# Patient Record
Sex: Female | Born: 1985 | Race: Black or African American | Hispanic: No | Marital: Single | Smoking: Never smoker
Health system: Southern US, Community
[De-identification: ages and names within clinical notes are randomized; demographics above are authoritative.]

## PROBLEM LIST (undated history)

## (undated) ENCOUNTER — Inpatient Hospital Stay (HOSPITAL_COMMUNITY): Payer: Self-pay

## (undated) DIAGNOSIS — Z789 Other specified health status: Secondary | ICD-10-CM

## (undated) DIAGNOSIS — Z9049 Acquired absence of other specified parts of digestive tract: Secondary | ICD-10-CM

## (undated) HISTORY — PX: APPENDECTOMY: SHX54

---

## 2013-01-01 ENCOUNTER — Encounter (HOSPITAL_BASED_OUTPATIENT_CLINIC_OR_DEPARTMENT_OTHER): Payer: Self-pay

## 2013-01-01 ENCOUNTER — Emergency Department (HOSPITAL_BASED_OUTPATIENT_CLINIC_OR_DEPARTMENT_OTHER)
Admission: EM | Admit: 2013-01-01 | Discharge: 2013-01-01 | Disposition: A | Discharge status: Home / Self Care | Payer: Self-pay | Attending: Emergency Medicine | Admitting: Emergency Medicine

## 2013-01-01 DIAGNOSIS — L02213 Cutaneous abscess of chest wall: Secondary | ICD-10-CM

## 2013-01-01 DIAGNOSIS — L02219 Cutaneous abscess of trunk, unspecified: Secondary | ICD-10-CM | POA: Insufficient documentation

## 2013-01-01 MED ORDER — MUPIROCIN CALCIUM 2 % EX CREA
TOPICAL_CREAM | Freq: Once | CUTANEOUS | Status: AC
  Administered 2013-01-01: 03:00:00 via TOPICAL

## 2013-01-01 NOTE — ED Notes (Signed)
Patient here with possible abscess to middle of chest, right along bra line. Originally started as raised red bump but today started draining

## 2013-01-01 NOTE — ED Provider Notes (Signed)
   History    CSN: [id number] Arrival date & time 01/01/13  0119  First MD Initiated Contact with Patient 01/01/13 0244     Chief Complaint  Patient presents with  . Abscess   (Consider location/radiation/quality/duration/timing/severity/associated sxs/prior Treatment) HPI This is a 27 year old female with a several day history of a "bump" in the center of her chest underlying the strap of her bra. The bra strap has been rubbing it and it has been moderately painful. It began draining yesterday. She has not been bandaging it or putting any medication. She denies any systemic symptoms.  History reviewed. No pertinent past medical history. History reviewed. No pertinent past surgical history. No family history on file. History  Substance Use Topics  . Smoking status: Never Smoker   . Smokeless tobacco: Not on file  . Alcohol Use: Not on file   OB History   Grav Para Term Preterm Abortions TAB SAB Ect Mult Living                 Review of Systems  All other systems reviewed and are negative.    Allergies  Review of patient's allergies indicates no known allergies.  Home Medications   Current Outpatient Rx  Name  Route  Sig  Dispense  Refill  . norethindrone-ethinyl estradiol (TRIPHASIL,CYCLAFEM,ALYACEN) 0.5/0.75/1-35 MG-MCG tablet   Oral   Take 1 tablet by mouth daily.          BP 130/64  Pulse 75  Temp(Src) 98.6 F (37 C) (Oral)  Resp 18  SpO2 100%  Physical Exam General: Well-developed, well-nourished female in no acute distress; appearance consistent with age of record HENT: normocephalic, atraumatic Eyes: Normal appearance Neck: supple Heart: regular rate and rhythm Lungs: clear to auscultation bilaterally Abdomen: soft; nondistended Extremities: No deformity; full range of motion Neurologic: Awake, alert and oriented; motor function intact in all extremities and symmetric; no facial droop Skin: Warm and dry; small draining abscess in the center chest  underlying location of the bra strap without fluctuance or surrounding erythema or warmth Psychiatric: Normal mood and affect    ED Course  Procedures (including critical care time)    MDM  I&D not indicated at this time his wound is draining and there is no pus pocket. We will treat with topical mupirocin.  Hanley Seamen, MD 01/01/13 [phone]

## 2013-01-12 ENCOUNTER — Emergency Department (HOSPITAL_BASED_OUTPATIENT_CLINIC_OR_DEPARTMENT_OTHER)
Admission: EM | Admit: 2013-01-12 | Discharge: 2013-01-12 | Disposition: A | Discharge status: Home / Self Care | Payer: Self-pay | Attending: Emergency Medicine | Admitting: Emergency Medicine

## 2013-01-12 ENCOUNTER — Encounter (HOSPITAL_BASED_OUTPATIENT_CLINIC_OR_DEPARTMENT_OTHER): Payer: Self-pay | Admitting: Emergency Medicine

## 2013-01-12 DIAGNOSIS — L0390 Cellulitis, unspecified: Secondary | ICD-10-CM

## 2013-01-12 DIAGNOSIS — Z79899 Other long term (current) drug therapy: Secondary | ICD-10-CM | POA: Insufficient documentation

## 2013-01-12 DIAGNOSIS — J869 Pyothorax without fistula: Secondary | ICD-10-CM | POA: Insufficient documentation

## 2013-01-12 MED ORDER — IBUPROFEN 400 MG PO TABS
400.0000 mg | ORAL_TABLET | Freq: Once | ORAL | Status: AC
  Administered 2013-01-12: 400 mg via ORAL
  Filled 2013-01-12: qty 1

## 2013-01-12 MED ORDER — SULFAMETHOXAZOLE-TRIMETHOPRIM 800-160 MG PO TABS: 1.0000 | ORAL_TABLET | Freq: Two times a day (BID) | ORAL | Status: DC

## 2013-01-12 MED ORDER — IBUPROFEN 600 MG PO TABS: 600.0000 mg | ORAL_TABLET | Freq: Four times a day (QID) | ORAL | Status: DC | PRN

## 2013-01-12 MED ORDER — SULFAMETHOXAZOLE-TMP DS 800-160 MG PO TABS
1.0000 | ORAL_TABLET | Freq: Once | ORAL | Status: AC
  Administered 2013-01-12: 1 via ORAL
  Filled 2013-01-12: qty 1

## 2013-01-12 NOTE — ED Notes (Signed)
Pt reports abscess to right flank that appeared on Monday, has been using hot compresses, but area is getting worse

## 2013-01-12 NOTE — ED Provider Notes (Signed)
   History    CSN: [id number] Arrival date & time 01/12/13  0044  First MD Initiated Contact with Patient 01/12/13 0053     Chief Complaint  Patient presents with  . Abscess   (Consider location/radiation/quality/duration/timing/severity/associated sxs/prior Treatment) Patient is a 27 y.o. female presenting with abscess. The history is provided by the patient. No language interpreter was used.  Abscess Location:  Torso Torso abscess location:  R chest (lateral chest last rib) Abscess quality: induration, painful and redness   Abscess quality: not draining   Red streaking: no   Progression:  Worsening Pain details:    Quality:  Aching   Severity:  Moderate   Timing:  Constant   Progression:  Unchanged Chronicity:  Recurrent Context: not skin injury   Relieved by:  Nothing Worsened by:  Nothing tried Ineffective treatments:  None tried Associated symptoms: no fever   Risk factors: prior abscess    History reviewed. No pertinent past medical history. History reviewed. No pertinent past surgical history. History reviewed. No pertinent family history. History  Substance Use Topics  . Smoking status: Never Smoker   . Smokeless tobacco: Not on file  . Alcohol Use: No   OB History   Grav Para Term Preterm Abortions TAB SAB Ect Mult Living                 Review of Systems  Constitutional: Negative for fever.  All other systems reviewed and are negative.    Allergies  Review of patient's allergies indicates no known allergies.  Home Medications   Current Outpatient Rx  Name  Route  Sig  Dispense  Refill  . norethindrone-ethinyl estradiol (TRIPHASIL,CYCLAFEM,ALYACEN) 0.5/0.75/1-35 MG-MCG tablet   Oral   Take 1 tablet by mouth daily.         Marland Kitchen ibuprofen (ADVIL,MOTRIN) 600 MG tablet   Oral   Take 1 tablet (600 mg total) by mouth every 6 (six) hours as needed for pain.   30 tablet   0   . sulfamethoxazole-trimethoprim (SEPTRA DS) 800-160 MG per tablet    Oral   Take 1 tablet by mouth every 12 (twelve) hours.   14 tablet   0    BP 137/76  Temp(Src) 98.6 F (37 C)  Ht 5\' 8"  (1.727 m)  Wt 240 lb (108.863 kg)  BMI 36.5 kg/m2  SpO2 100%  LMP 12/24/2012 Physical Exam  Constitutional: She is oriented to person, place, and time. She appears well-developed and well-nourished. No distress.  HENT:  Head: Normocephalic and atraumatic.  Mouth/Throat: Oropharynx is clear and moist.  Eyes: Conjunctivae are normal. Pupils are equal, round, and reactive to light.  Neck: Normal range of motion. Neck supple.  Cardiovascular: Normal rate, regular rhythm and intact distal pulses.   Pulmonary/Chest: Effort normal and breath sounds normal. She has no wheezes. She has no rales.  Abdominal: Soft. Bowel sounds are normal. There is no tenderness. There is no rebound and no guarding.  Musculoskeletal: Normal range of motion.  Neurological: She is alert and oriented to person, place, and time.  Skin: Skin is warm and dry.     Psychiatric: She has a normal mood and affect.    ED Course  Procedures (including critical care time) Labs Reviewed - No data to display No results found. 1. 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

## 2013-10-09 ENCOUNTER — Emergency Department (HOSPITAL_BASED_OUTPATIENT_CLINIC_OR_DEPARTMENT_OTHER)
Admission: EM | Admit: 2013-10-09 | Discharge: 2013-10-09 | Disposition: A | Discharge status: Home / Self Care | Payer: Medicaid Other | Attending: Emergency Medicine | Admitting: Emergency Medicine

## 2013-10-09 ENCOUNTER — Encounter (HOSPITAL_BASED_OUTPATIENT_CLINIC_OR_DEPARTMENT_OTHER): Payer: Self-pay | Admitting: Emergency Medicine

## 2013-10-09 DIAGNOSIS — Z79899 Other long term (current) drug therapy: Secondary | ICD-10-CM | POA: Insufficient documentation

## 2013-10-09 DIAGNOSIS — Z3202 Encounter for pregnancy test, result negative: Secondary | ICD-10-CM | POA: Insufficient documentation

## 2013-10-09 DIAGNOSIS — R609 Edema, unspecified: Secondary | ICD-10-CM | POA: Insufficient documentation

## 2013-10-09 DIAGNOSIS — Z792 Long term (current) use of antibiotics: Secondary | ICD-10-CM | POA: Insufficient documentation

## 2013-10-09 DIAGNOSIS — R600 Localized edema: Secondary | ICD-10-CM

## 2013-10-09 LAB — URINALYSIS, ROUTINE W REFLEX MICROSCOPIC
BILIRUBIN URINE: NEGATIVE
Glucose, UA: NEGATIVE mg/dL
HGB URINE DIPSTICK: NEGATIVE
KETONES UR: NEGATIVE mg/dL
Nitrite: NEGATIVE
PROTEIN: NEGATIVE mg/dL
SPECIFIC GRAVITY, URINE: 1.026 (ref 1.005–1.030)
UROBILINOGEN UA: 0.2 mg/dL (ref 0.0–1.0)
pH: 7 (ref 5.0–8.0)

## 2013-10-09 LAB — URINE MICROSCOPIC-ADD ON

## 2013-10-09 LAB — PREGNANCY, URINE: Preg Test, Ur: NEGATIVE

## 2013-10-09 LAB — D-DIMER, QUANTITATIVE (NOT AT ARMC): D DIMER QUANT: 0.4 ug{FEU}/mL (ref 0.00–0.48)

## 2013-10-09 NOTE — ED Notes (Signed)
Pt c/o bil lower leg swelling x 2 weeks

## 2013-10-09 NOTE — ED Provider Notes (Signed)
CSN: [id number]     Arrival date & time 10/09/13  1508 History  This chart was scribed for Tracy Hamilton Tracy Rappaport, MD by Smiley HousemanFallon Davis, ED Scribe. The patient was seen in room MH02/MH02. Patient's care was started at 3:26 PM.  Chief Complaint  Patient presents with  . Leg Swelling   The history is provided by the patient. No language interpreter was used.   HPI Comments: Tracy Hamilton is a 28 y.o. female who presents to the Emergency Department complaining of intermittent worsening bilateral lower extremity swelling that started about 2 weeks ago.  Pt reports the swelling in her left leg is worse than her right.  Pt states she has pain in her left ankle, but denies pain in her right.  She denies any injuries to her legs.  Pt states her work consists of walking and constant standing, which might have worsened the swelling.  Pt states she tries elevating her legs with some relief at night.  Pt denies trying compression socks.  Pt denies a high salt diet and using birth control pills.  She denies h/o blood clots, recent surgeries, and recent travels.  She states she is otherwise healthy.    History reviewed. No pertinent past medical history. History reviewed. No pertinent past surgical history. History reviewed. No pertinent family history. History  Substance Use Topics  . Smoking status: Never Smoker   . Smokeless tobacco: Not on file  . Alcohol Use: No   OB History   Grav Para Term Preterm Abortions TAB SAB Ect Mult Living                 Review of Systems  Constitutional: Negative for fever.  Respiratory: Negative for cough, chest tightness and shortness of breath.   Cardiovascular: Positive for leg swelling. Negative for chest pain.  Gastrointestinal: Negative for abdominal pain.  Genitourinary: Negative for dysuria and decreased urine volume.  All other systems reviewed and are negative.     Allergies  Review of patient's allergies indicates no known allergies.  Home Medications    Prior to Admission medications   Medication Sig Start Date End Date Taking? Authorizing Provider  ibuprofen (ADVIL,MOTRIN) 600 MG tablet Take 1 tablet (600 mg total) by mouth every 6 (six) hours as needed for pain. 01/12/13   April K Palumbo-Rasch, MD  norethindrone-ethinyl estradiol (TRIPHASIL,CYCLAFEM,ALYACEN) 0.5/0.75/1-35 MG-MCG tablet Take 1 tablet by mouth daily.    Historical Provider, MD  sulfamethoxazole-trimethoprim (SEPTRA DS) 800-160 MG per tablet Take 1 tablet by mouth every 12 (twelve) hours. 01/12/13   April K Palumbo-Rasch, MD   Triage Vitals: BP 106/64  Pulse 85  Temp(Src) 98.4 Hamilton (36.9 C) (Oral)  Resp 16  Ht 5\' 8"  (1.727 m)  Wt 265 lb (120.203 kg)  BMI 40.30 kg/m2  SpO2 99%  LMP 09/20/2013 Physical Exam  Nursing note and vitals reviewed. Constitutional: She is oriented to person, place, and time. She appears well-developed and well-nourished. No distress.  HENT:  Head: Normocephalic and atraumatic.  Cardiovascular: Normal rate, regular rhythm and normal heart sounds.   No murmur heard. Pulmonary/Chest: Effort normal and breath sounds normal. No respiratory distress. She has no wheezes.  Abdominal: Soft. There is no tenderness.  Musculoskeletal:  1+ bilateral lower extremity edema left slightly worse than right, 2+ DP pulses, neurovascularly intact distally, no Homans sign  Neurological: She is alert and oriented to person, place, and time.  Skin: Skin is warm and dry.  Psychiatric: She has a normal mood and affect.  ED Course  Procedures (including critical care time) DIAGNOSTIC STUDIES: Oxygen Saturation is 99% on RA, normal by my interpretation.    COORDINATION OF CARE: 3:32 PM-Will order pregnancy screen, D-dimer, and UA.  Patient informed of current plan of treatment and evaluation and agrees with plan.    Results for orders placed during the hospital encounter of 10/09/13  D-DIMER, QUANTITATIVE      Result Value Ref Range   D-Dimer, Quant 0.40   0.00 - 0.48 ug/mL-FEU  URINALYSIS, ROUTINE W REFLEX MICROSCOPIC      Result Value Ref Range   Color, Urine YELLOW  YELLOW   APPearance CLOUDY (*) CLEAR   Specific Gravity, Urine 1.026  1.005 - 1.030   pH 7.0  5.0 - 8.0   Glucose, UA NEGATIVE  NEGATIVE mg/dL   Hgb urine dipstick NEGATIVE  NEGATIVE   Bilirubin Urine NEGATIVE  NEGATIVE   Ketones, ur NEGATIVE  NEGATIVE mg/dL   Protein, ur NEGATIVE  NEGATIVE mg/dL   Urobilinogen, UA 0.2  0.0 - 1.0 mg/dL   Nitrite NEGATIVE  NEGATIVE   Leukocytes, UA TRACE (*) NEGATIVE  PREGNANCY, URINE      Result Value Ref Range   Preg Test, Ur NEGATIVE  NEGATIVE  URINE MICROSCOPIC-ADD ON      Result Value Ref Range   Squamous Epithelial / LPF FEW (*) RARE   WBC, UA 0-2  <3 WBC/hpf   Bacteria, UA RARE  RARE  '  MDM   Final diagnoses:  Edema of both legs   Patient presents with lower extremity edema. She is nontoxic on exam. She has no other complaints.  Vital signs are reassuring. She has no risk factors for DVT and dimer is negative. No evidence of proteinuria in the urine. Suspect patient's edema is dependent in nature. Discuss with patient using compression stockings and elevating her extremities when possible. [facility] was referred to [facility] for establishment of primary care.  After history, exam, and medical workup I feel the patient has been appropriately medically screened and is safe for discharge home. Pertinent diagnoses were discussed with the patient. Patient was given return precautions.   I personally performed the services described in this documentation, which was scribed in my presence. The recorded information has been reviewed and is accurate.      Tracy Hamilton Adamari Frede, MD 10/09/13 [phone]

## 2013-10-09 NOTE — Discharge Instructions (Signed)
Peripheral Edema You have swelling in your legs (peripheral edema). This swelling is due to excess accumulation of salt and water in your body. Edema may be a sign of heart, kidney or liver disease, or a side effect of a medication. It may also be due to problems in the leg veins. Elevating your legs and using special support stockings may be very helpful, if the cause of the swelling is due to poor venous circulation. Avoid long periods of standing, whatever the cause.  You edema is likely because of prolonged standing.  Treatment of edema depends on identifying the cause. Chips, pretzels, pickles and other salty foods should be avoided. Restricting salt in your diet is almost always needed. Water pills (diuretics) are often used to remove the excess salt and water from your body via urine. These medicines prevent the kidney from reabsorbing sodium. This increases urine flow.     SEEK IMMEDIATE MEDICAL CARE IF:   You have increased swelling, pain, redness, or heat in your legs.  You develop shortness of breath, especially when lying down.  You develop chest or abdominal pain, weakness, or fainting.  You have a fever. Document Released: 07/16/2004 Document Revised: 08/31/2011 Document Reviewed: 06/26/2009 [facility] Patient Information 2014 [place], [place].

## 2013-12-16 ENCOUNTER — Encounter (HOSPITAL_BASED_OUTPATIENT_CLINIC_OR_DEPARTMENT_OTHER): Payer: Self-pay | Admitting: Emergency Medicine

## 2013-12-16 ENCOUNTER — Emergency Department (HOSPITAL_BASED_OUTPATIENT_CLINIC_OR_DEPARTMENT_OTHER)
Admission: EM | Admit: 2013-12-16 | Discharge: 2013-12-16 | Disposition: A | Discharge status: Home / Self Care | Payer: Medicaid Other | Attending: Emergency Medicine | Admitting: Emergency Medicine

## 2013-12-16 ENCOUNTER — Emergency Department (HOSPITAL_BASED_OUTPATIENT_CLINIC_OR_DEPARTMENT_OTHER): Payer: Medicaid Other

## 2013-12-16 DIAGNOSIS — Z791 Long term (current) use of non-steroidal anti-inflammatories (NSAID): Secondary | ICD-10-CM | POA: Insufficient documentation

## 2013-12-16 DIAGNOSIS — S76219A Strain of adductor muscle, fascia and tendon of unspecified thigh, initial encounter: Secondary | ICD-10-CM

## 2013-12-16 DIAGNOSIS — Z3202 Encounter for pregnancy test, result negative: Secondary | ICD-10-CM | POA: Insufficient documentation

## 2013-12-16 DIAGNOSIS — R109 Unspecified abdominal pain: Secondary | ICD-10-CM | POA: Insufficient documentation

## 2013-12-16 LAB — URINALYSIS, ROUTINE W REFLEX MICROSCOPIC
Bilirubin Urine: NEGATIVE
GLUCOSE, UA: NEGATIVE mg/dL
Hgb urine dipstick: NEGATIVE
Ketones, ur: 15 mg/dL — AB
LEUKOCYTES UA: NEGATIVE
Nitrite: NEGATIVE
PROTEIN: 30 mg/dL — AB
SPECIFIC GRAVITY, URINE: 1.045 — AB (ref 1.005–1.030)
UROBILINOGEN UA: 1 mg/dL (ref 0.0–1.0)
pH: 6 (ref 5.0–8.0)

## 2013-12-16 LAB — PREGNANCY, URINE: PREG TEST UR: NEGATIVE

## 2013-12-16 LAB — URINE MICROSCOPIC-ADD ON

## 2013-12-16 MED ORDER — IBUPROFEN 800 MG PO TABS
800.0000 mg | ORAL_TABLET | Freq: Once | ORAL | Status: AC
  Administered 2013-12-16: 800 mg via ORAL
  Filled 2013-12-16: qty 1

## 2013-12-16 MED ORDER — IBUPROFEN 800 MG PO TABS: 800.0000 mg | ORAL_TABLET | Freq: Three times a day (TID) | ORAL | Status: DC

## 2013-12-16 NOTE — Discharge Instructions (Signed)
Groin Strain °A groin strain (also called a groin pull) is an injury to the muscles or tendon on the upper inner part of the thigh. These muscles are called the adductor muscles or groin muscles. They are responsible for moving the leg across the body. A muscle strain occurs when a muscle is overstretched and some muscle fibers are torn. A groin strain can range from mild to severe depending on how many muscle fibers are affected and whether the muscle fibers are partially or completely torn.  °Groin strains usually occur during exercise or participation in sports. The injury often happens when a sudden, violent force is placed on a muscle, stretching the muscle too far. A strain is more likely to occur when your muscles are not warmed up or if you are not properly conditioned. Depending on the severity of the groin strain, recovery time may vary from a few weeks to several weeks. Severe injuries often require 4-6 weeks for recovery. In these cases, complete healing can take 4-5 months.  °CAUSES  °· Stretching the groin muscles too far or too suddenly, often during side-to-side motion with an abrupt change in direction. °· Putting repeated stress on the groin muscles over a long period of time. °· Performing vigorous activity without properly stretching the groin muscles beforehand. °SYMPTOMS  °· Pain and tenderness in the groin area. This begins as sharp pain and persists as a dull ache. °· Popping or snapping feeling when the injury occurs (for severe strains). °· Swelling or bruising. °· Muscle spasms. °· Weakness in the leg. °· Stiffness in the groin area with decreased ability to move the affected muscles. °DIAGNOSIS  °Your caregiver will perform a physical exam to diagnose a groin strain. You will be asked about your symptoms and how the injury occurred. X-rays are sometimes needed to rule out a broken bone or cartilage problems. Your caregiver may order a CT scan or MRI if a complete muscle tear is  suspected. °TREATMENT  °A groin strain will often heal on its own. Your caregiver may prescribe medicines to help manage pain and swelling (anti-inflammatory medicine). You may be told to use crutches for the first few days to minimize your pain. °HOME CARE INSTRUCTIONS  °· Rest. Do not use the strained muscle if it causes pain. °· Put ice on the injured area. °¨ Put ice in a plastic bag. °¨ Place a towel between your skin and the bag. °¨ Leave the ice on for 15-20 minutes, every 2-3 hours. Do this for the first 2 days after the injury.  °· Only take over-the-counter or prescription medicines as directed by your caregiver. °· Wrap the injured area with an elastic bandage as directed by your caregiver. °· Keep the injured leg raised (elevated). °· Walk, stretch, and perform range-of-motion exercises to improve blood flow to the injured area. Only perform these activities if you can do so without any pain. °To prevent muscle strains: °· Warm up before exercise. °· Develop proper conditioning and strength in the groin muscles. °SEEK IMMEDIATE MEDICAL CARE IF:  °· You have increased pain or swelling in the affected area.   °· Your symptoms are not improving or are getting worse. °MAKE SURE YOU:  °· Understand these instructions. °· Will watch your condition. °· Will get help right away if you are not doing well or get worse. °Document Released: 02/04/2004 Document Revised: 05/25/2012 Document Reviewed: 02/10/2012 °Exit[employer]® Patient Information ©2015 [employer]. This information is not intended to replace advice given to you   by your health care provider. Make sure you discuss any questions you have with your health care provider. ° °

## 2013-12-16 NOTE — ED Notes (Signed)
Patient here with left groin pain x 2 weeks after working out, pain with ambulation and movement

## 2013-12-16 NOTE — ED Provider Notes (Signed)
CSN: [id number]     Arrival date & time 12/16/13  1151 History  This chart was scribed for Tracy OctaveStephen Rancour, MD by Tracy Hamilton, ED Scribe. The patient was seen in room MH01/MH01. Patient's care was started at 12:09 PM.   Chief Complaint  Patient presents with  . Groin Pain    The history is provided by the patient. No language interpreter was used.    HPI Comments: Tracy Hamilton is a 28 y.o. female who presents to the Emergency Department complaining of intermittent, mild to moderate left groin pain onset 2 weeks ago. Patient states that pain began shortly after exercise. She says that she usually does not have pain while at rest, but pain is worse with movement of her left leg and ambulation. She has been taking Tylenol without relief. She denies any relieving factors. She denies any recent injuries, traumas or falls. Patient has been eating and drinking normally. She denies back pain, numbness or weakness, abdominal pain, hematuria, dysuria, vaginal bleeding, constipation, diarrhea, vaginal discharge, fever, vomiting or any other symptoms at this time. She has no chronic medical conditions.   PCP - Health Department   History reviewed. No pertinent past medical history. History reviewed. No pertinent past surgical history. No family history on file. History  Substance Use Topics  . Smoking status: Never Smoker   . Smokeless tobacco: Not on file  . Alcohol Use: No   OB History   Grav Para Term Preterm Abortions TAB SAB Ect Mult Living                 Review of Systems A complete 10 system review of systems was obtained and all systems are negative except as noted in the HPI and PMH.   Allergies  Review of patient's allergies indicates no known allergies.  Home Medications   Prior to Admission medications   Medication Sig Start Date End Date Taking? Authorizing Provider  ibuprofen (ADVIL,MOTRIN) 800 MG tablet Take 1 tablet (800 mg total) by mouth 3 (three) times daily.  12/16/13   Tracy OctaveStephen Rancour, MD   Triage Vitals: BP 122/72  Pulse 88  Temp(Src) 97.9 F (36.6 C) (Oral)  Resp 15  SpO2 98% Physical Exam  Nursing note and vitals reviewed. Constitutional: She is oriented to person, place, and time. She appears well-developed and well-nourished. No distress.  HENT:  Head: Normocephalic and atraumatic.  Mouth/Throat: Oropharynx is clear and moist. No oropharyngeal exudate.  Eyes: Conjunctivae and EOM are normal. Pupils are equal, round, and reactive to light.  Neck: Normal range of motion. Neck supple.  No meningismus.  Cardiovascular: Normal rate, regular rhythm, normal heart sounds and intact distal pulses.   No murmur heard. Pulmonary/Chest: Effort normal and breath sounds normal. No respiratory distress.  Abdominal: Soft. There is no tenderness. There is no rebound and no guarding.  Genitourinary:  No inguinal or femoral hernias appreciable. Chaperone present.  Musculoskeletal: Normal range of motion. She exhibits no edema and no tenderness.  Neurological: She is alert and oriented to person, place, and time. No cranial nerve deficit. She exhibits normal muscle tone. Coordination normal.  No ataxia on finger to nose bilaterally. No pronator drift. 5/5 strength throughout. CN 2-12 intact. Negative Romberg. Equal grip strength. Sensation intact. Gait is normal.   Skin: Skin is warm.  Psychiatric: She has a normal mood and affect. Her behavior is normal.    ED Course  Procedures (including critical care time) DIAGNOSTIC STUDIES: Oxygen Saturation is 98% on room air,  normal by my interpretation.    COORDINATION OF CARE: 12:14 PM- Patient informed of current plan for treatment and evaluation and agrees with plan at this time.    Labs Review Labs Reviewed  URINALYSIS, ROUTINE W REFLEX MICROSCOPIC - Abnormal; Notable for the following:    Color, Urine AMBER (*)    Specific Gravity, Urine 1.045 (*)    Ketones, ur 15 (*)    Protein, ur 30 (*)     All other components within normal limits  URINE MICROSCOPIC-ADD ON - Abnormal; Notable for the following:    Bacteria, UA MANY (*)    All other components within normal limits  PREGNANCY, URINE    Imaging Review Dg Hip Complete Left  12/16/2013   CLINICAL DATA:  28 year old female with pain radiating to the left groin and lower extremity. Initial encounter.  EXAM: LEFT HIP - COMPLETE 2+ VIEW  COMPARISON:  None.  FINDINGS: Bone mineralization is within normal limits. Femoral heads normally located. Hip joint spaces preserved. Pelvis intact. SI joints within normal limits. Proximal left femur intact.  IMPRESSION: Negative radiographic appearance of the left hip and pelvis.   Electronically Signed   By: Augusto GambleLee  Hall M.D.   On: 12/16/2013 13:23     EKG Interpretation None      MDM   Final diagnoses:  Groin strain, initial encounter   Left groin pain for the past 2 weeks after working out. Worse with ambulation and movement. Not taking anything at home. No focal weakness, numbness or tingling. No bowel or bladder incontinence.  X-ray negative. Urinalysis shows bacteria but no other signs of infection. Patient able to ambulate. Suspect groin muscle strain. No focal weakness, numbness or tingling. She conservatively with anti-inflammatories, Rice therapy, follow up with PCP  I personally performed the services described in this documentation, which was scribed in my presence. The recorded information has been reviewed and is accurate.   Tracy OctaveStephen Rancour, MD 12/16/13 [phone]

## 2014-05-01 ENCOUNTER — Emergency Department (HOSPITAL_BASED_OUTPATIENT_CLINIC_OR_DEPARTMENT_OTHER)
Admission: EM | Admit: 2014-05-01 | Discharge: 2014-05-01 | Disposition: A | Discharge status: Home / Self Care | Payer: Medicaid Other | Attending: Emergency Medicine | Admitting: Emergency Medicine

## 2014-05-01 ENCOUNTER — Encounter (HOSPITAL_BASED_OUTPATIENT_CLINIC_OR_DEPARTMENT_OTHER): Payer: Self-pay | Admitting: *Deleted

## 2014-05-01 DIAGNOSIS — N760 Acute vaginitis: Secondary | ICD-10-CM | POA: Insufficient documentation

## 2014-05-01 DIAGNOSIS — Z79899 Other long term (current) drug therapy: Secondary | ICD-10-CM | POA: Insufficient documentation

## 2014-05-01 DIAGNOSIS — Z792 Long term (current) use of antibiotics: Secondary | ICD-10-CM | POA: Insufficient documentation

## 2014-05-01 DIAGNOSIS — B9689 Other specified bacterial agents as the cause of diseases classified elsewhere: Secondary | ICD-10-CM

## 2014-05-01 DIAGNOSIS — Z113 Encounter for screening for infections with a predominantly sexual mode of transmission: Secondary | ICD-10-CM | POA: Insufficient documentation

## 2014-05-01 DIAGNOSIS — R102 Pelvic and perineal pain: Secondary | ICD-10-CM

## 2014-05-01 DIAGNOSIS — Z711 Person with feared health complaint in whom no diagnosis is made: Secondary | ICD-10-CM

## 2014-05-01 DIAGNOSIS — R300 Dysuria: Secondary | ICD-10-CM | POA: Insufficient documentation

## 2014-05-01 LAB — URINALYSIS, ROUTINE W REFLEX MICROSCOPIC
BILIRUBIN URINE: NEGATIVE
Glucose, UA: NEGATIVE mg/dL
HGB URINE DIPSTICK: NEGATIVE
Ketones, ur: NEGATIVE mg/dL
Leukocytes, UA: NEGATIVE
Nitrite: NEGATIVE
PH: 6 (ref 5.0–8.0)
Protein, ur: NEGATIVE mg/dL
SPECIFIC GRAVITY, URINE: 1.005 (ref 1.005–1.030)
Urobilinogen, UA: 0.2 mg/dL (ref 0.0–1.0)

## 2014-05-01 LAB — WET PREP, GENITAL
TRICH WET PREP: NONE SEEN
YEAST WET PREP: NONE SEEN

## 2014-05-01 LAB — PREGNANCY, URINE: Preg Test, Ur: NEGATIVE

## 2014-05-01 MED ORDER — CEFTRIAXONE SODIUM 250 MG IJ SOLR
250.0000 mg | Freq: Once | INTRAMUSCULAR | Status: AC
  Administered 2014-05-01: 250 mg via INTRAMUSCULAR
  Filled 2014-05-01: qty 250

## 2014-05-01 MED ORDER — METRONIDAZOLE 500 MG PO TABS: 500.0000 mg | ORAL_TABLET | Freq: Two times a day (BID) | ORAL | Status: DC

## 2014-05-01 MED ORDER — LEVONORGESTREL 0.75 MG PO TABS: 0.7500 mg | ORAL_TABLET | Freq: Two times a day (BID) | ORAL | Status: DC

## 2014-05-01 MED ORDER — AZITHROMYCIN 250 MG PO TABS
1000.0000 mg | ORAL_TABLET | Freq: Once | ORAL | Status: AC
  Administered 2014-05-01: 1000 mg via ORAL
  Filled 2014-05-01: qty 4

## 2014-05-01 MED ORDER — LIDOCAINE HCL (PF) 1 % IJ SOLN
INTRAMUSCULAR | Status: AC
  Administered 2014-05-01: 2 mL
  Filled 2014-05-01: qty 5

## 2014-05-01 NOTE — Discharge Instructions (Signed)
Take antibiotic Flagyl to completion. Take Plan B as directed, it is important for you to take this as soon as possible as it is only useful within the first 72 hours. You were treated today for both gonorrhea and Chlamydia. If these tests results positive, you will be contacted and are then obligated to inform your partner.  Bacterial Vaginosis Bacterial vaginosis is a vaginal infection that occurs when the normal balance of bacteria in the vagina is disrupted. It results from an overgrowth of certain bacteria. This is the most common vaginal infection in women of childbearing age. Treatment is important to prevent complications, especially in pregnant women, as it can cause a premature delivery. CAUSES  Bacterial vaginosis is caused by an increase in harmful bacteria that are normally present in smaller amounts in the vagina. Several different kinds of bacteria can cause bacterial vaginosis. However, the reason that the condition develops is not fully understood. RISK FACTORS Certain activities or behaviors can put you at an increased risk of developing bacterial vaginosis, including:  Having a new sex partner or multiple sex partners.  Douching.  Using an intrauterine device (IUD) for contraception. Women do not get bacterial vaginosis from toilet seats, bedding, swimming pools, or contact with objects around them. SIGNS AND SYMPTOMS  Some women with bacterial vaginosis have no signs or symptoms. Common symptoms include:  Grey vaginal discharge.  A fishlike odor with discharge, especially after sexual intercourse.  Itching or burning of the vagina and vulva.  Burning or pain with urination. DIAGNOSIS  Your health care provider will take a medical history and examine the vagina for signs of bacterial vaginosis. A sample of vaginal fluid may be taken. Your health care provider will look at this sample under a microscope to check for bacteria and abnormal cells. A vaginal pH test may also  be done.  TREATMENT  Bacterial vaginosis may be treated with antibiotic medicines. These may be given in the form of a pill or a vaginal cream. A second round of antibiotics may be prescribed if the condition comes back after treatment.  HOME CARE INSTRUCTIONS   Only take over-the-counter or prescription medicines as directed by your health care provider.  If antibiotic medicine was prescribed, take it as directed. Make sure you finish it even if you start to feel better.  Do not have sex until treatment is completed.  Tell all sexual partners that you have a vaginal infection. They should see their health care provider and be treated if they have problems, such as a mild rash or itching.  Practice safe sex by using condoms and only having one sex partner. SEEK MEDICAL CARE IF:   Your symptoms are not improving after 3 days of treatment.  You have increased discharge or pain.  You have a fever. MAKE SURE YOU:   Understand these instructions.  Will watch your condition.  Will get help right away if you are not doing well or get worse. FOR MORE INFORMATION  Centers for Disease Control and Prevention, Division of STD Prevention: SolutionApps.co.zawww.cdc.gov/std American Sexual Health Association (ASHA): www.ashastd.org  Document Released: 06/08/2005 Document Revised: 03/29/2013 Document Reviewed: 01/18/2013 [facility] LP[facility] Patient Information 2015 [place], [place]. This information is not intended to replace advice given to you by your health care provider. Make sure you discuss any questions you have with your health care provider.

## 2014-05-01 NOTE — ED Notes (Signed)
Pt discharged to home NAD.  

## 2014-05-01 NOTE — ED Notes (Signed)
Vaginal pain. States she had a condom stuck in her vagina for a day but was eventually be able to remove it. She has a strange feeling inside her vagina now.

## 2014-05-01 NOTE — ED Provider Notes (Signed)
CSN: [id number]     Arrival date & time 05/01/14  1814 History   First MD Initiated Contact with Patient 05/01/14 1816     Chief Complaint  Patient presents with  . Vaginal Pain     (Consider location/radiation/quality/duration/timing/severity/associated sxs/prior Treatment) HPI Comments: This is a 28 year old female who presents to the emergency department complaining of vaginal pain 1 day. Patient reports yesterday morning she had intercourse with a condom and the condom remained in her vagina for half the day until she was able to pull it out. She reports today some discomfort in her vagina with mild dysuria. Denies vaginal discharge or odor. No vaginal bleeding. States she is monogamous with one partner. She is requesting treatment for STDs and would like to receive Plan B to prevent pregnancy. Denies abdominal pain, nausea, vomiting, fever or chills.  Patient is a 28 y.o. female presenting with vaginal pain. The history is provided by the patient.  Vaginal Pain    History reviewed. No pertinent past medical history. Past Surgical History  Procedure Laterality Date  . Appendectomy     No family history on file. History  Substance Use Topics  . Smoking status: Never Smoker   . Smokeless tobacco: Not on file  . Alcohol Use: No   OB History    No data available     Review of Systems  Genitourinary: Positive for dysuria and vaginal pain.  All other systems reviewed and are negative.     Allergies  Review of patient's allergies indicates no known allergies.  Home Medications   Prior to Admission medications   Medication Sig Start Date End Date Taking? Authorizing Provider  ibuprofen (ADVIL,MOTRIN) 800 MG tablet Take 1 tablet (800 mg total) by mouth 3 (three) times daily. 12/16/13   [name], MD  levonorgestrel (PLAN B) 0.75 MG tablet Take 1 tablet (0.75 mg total) by mouth every 12 (twelve) hours. 05/01/14   Shanay Woolman M Kaitlinn Iversen, PA-C  metroNIDAZOLE (FLAGYL) 500 MG  tablet Take 1 tablet (500 mg total) by mouth 2 (two) times daily. One po bid x 7 days 05/01/14   Graelyn Bihl M Catera Hankins, PA-C   BP 120/70 mmHg  Pulse 74  Temp(Src) 97.8 F (36.6 C) (Oral)  Resp 20  Ht 5\' 7"  (1.702 m)  Wt 210 lb (95.255 kg)  BMI 32.88 kg/m2  SpO2 100%  LMP 04/10/2014 Physical Exam  Constitutional: She is oriented to person, place, and time. She appears well-developed and well-nourished. No distress.  HENT:  Head: Normocephalic and atraumatic.  Mouth/Throat: Oropharynx is clear and moist.  Eyes: Conjunctivae are normal.  Neck: Normal range of motion. Neck supple.  Cardiovascular: Normal rate, regular rhythm and normal heart sounds.   Pulmonary/Chest: Effort normal and breath sounds normal.  Abdominal: Soft. Bowel sounds are normal. There is no tenderness.  Genitourinary: Uterus normal. Uterus is not tender. Cervix exhibits no motion tenderness, no discharge and no friability. Right adnexum displays no mass, no tenderness and no fullness. Left adnexum displays no mass, no tenderness and no fullness. No erythema, tenderness or bleeding in the vagina. No foreign body around the vagina. Vaginal discharge (scant white) found.  Musculoskeletal: Normal range of motion. She exhibits no edema.  Neurological: She is alert and oriented to person, place, and time.  Skin: Skin is warm and dry. She is not diaphoretic.  Psychiatric: She has a normal mood and affect. Her behavior is normal.  Nursing note and vitals reviewed.   ED Course  Procedures (including critical  care time) Labs Review Labs Reviewed  WET PREP, GENITAL - Abnormal; Notable for the following:    Clue Cells Wet Prep HPF POC FEW (*)    WBC, Wet Prep HPF POC FEW (*)    All other components within normal limits  GC/CHLAMYDIA PROBE AMP  URINALYSIS, ROUTINE W REFLEX MICROSCOPIC  PREGNANCY, URINE    Imaging Review No results found.   EKG Interpretation None      MDM   Final diagnoses:  BV (bacterial vaginosis)   Concern about STD in female without diagnosis  Vaginal pain   Patient nontoxic appearing and in no apparent distress. Vital signs stable. Afebrile. No cervical discharge, cervical motion tenderness or adnexal tenderness. Doubt PID. She is requesting treatment for GC/chlamydia. Rocephin and azithromycin given. Will treat with Flagyl given vaginal symptoms and few clue cells on wet prep. Pregnancy negative. She is requesting Plan B given the intercourse was yesterday. Will prescribe Plan B. GC/Chlamydia cultures pending. Stable for discharge. Return precautions given. Patient states understanding of treatment care plan and is agreeable.  Kathrynn SpeedRobyn M Libertie Hausler, PA-C 05/01/14 1911  Enid SkeensJoshua M Zavitz, MD 05/02/14 [id number]

## 2014-05-02 LAB — GC/CHLAMYDIA PROBE AMP
CT PROBE, AMP APTIMA: NEGATIVE
GC Probe RNA: NEGATIVE

## 2014-05-27 ENCOUNTER — Encounter (HOSPITAL_BASED_OUTPATIENT_CLINIC_OR_DEPARTMENT_OTHER): Payer: Self-pay | Admitting: *Deleted

## 2014-05-27 ENCOUNTER — Emergency Department (HOSPITAL_BASED_OUTPATIENT_CLINIC_OR_DEPARTMENT_OTHER)
Admission: EM | Admit: 2014-05-27 | Discharge: 2014-05-27 | Disposition: A | Discharge status: Home / Self Care | Payer: Medicaid Other | Attending: Emergency Medicine | Admitting: Emergency Medicine

## 2014-05-27 DIAGNOSIS — B373 Candidiasis of vulva and vagina: Secondary | ICD-10-CM | POA: Insufficient documentation

## 2014-05-27 DIAGNOSIS — B3731 Acute candidiasis of vulva and vagina: Secondary | ICD-10-CM

## 2014-05-27 DIAGNOSIS — B9689 Other specified bacterial agents as the cause of diseases classified elsewhere: Secondary | ICD-10-CM

## 2014-05-27 DIAGNOSIS — Z792 Long term (current) use of antibiotics: Secondary | ICD-10-CM | POA: Insufficient documentation

## 2014-05-27 DIAGNOSIS — N760 Acute vaginitis: Secondary | ICD-10-CM | POA: Insufficient documentation

## 2014-05-27 DIAGNOSIS — Z3202 Encounter for pregnancy test, result negative: Secondary | ICD-10-CM | POA: Insufficient documentation

## 2014-05-27 DIAGNOSIS — Z79899 Other long term (current) drug therapy: Secondary | ICD-10-CM | POA: Insufficient documentation

## 2014-05-27 LAB — WET PREP, GENITAL: TRICH WET PREP: NONE SEEN

## 2014-05-27 LAB — URINALYSIS, ROUTINE W REFLEX MICROSCOPIC
BILIRUBIN URINE: NEGATIVE
GLUCOSE, UA: NEGATIVE mg/dL
HGB URINE DIPSTICK: NEGATIVE
KETONES UR: NEGATIVE mg/dL
Leukocytes, UA: NEGATIVE
Nitrite: NEGATIVE
PROTEIN: NEGATIVE mg/dL
Specific Gravity, Urine: 1.014 (ref 1.005–1.030)
Urobilinogen, UA: 0.2 mg/dL (ref 0.0–1.0)
pH: 8.5 — ABNORMAL HIGH (ref 5.0–8.0)

## 2014-05-27 LAB — PREGNANCY, URINE: Preg Test, Ur: NEGATIVE

## 2014-05-27 MED ORDER — METRONIDAZOLE 500 MG PO TABS: 500.0000 mg | ORAL_TABLET | Freq: Two times a day (BID) | ORAL | Status: DC

## 2014-05-27 MED ORDER — FLUCONAZOLE 50 MG PO TABS
150.0000 mg | ORAL_TABLET | Freq: Once | ORAL | Status: AC
  Administered 2014-05-27: 150 mg via ORAL
  Filled 2014-05-27 (×2): qty 1

## 2014-05-27 NOTE — ED Provider Notes (Signed)
CSN: [id number]     Arrival date & time 05/27/14  1327 History   First MD Initiated Contact with Patient 05/27/14 1343     Chief Complaint  Patient presents with  . Vaginal Discharge     (Consider location/radiation/quality/duration/timing/severity/associated sxs/prior Treatment) HPI Comments: Patient presents with complaint of vaginal pain for the past 3 days. Pain is particularly worse with intercourse. She denies vaginal discharge, vaginal bleeding. Her last menstrual period ended 2 days ago and was normal for her. She states that she was using a new soap but stopped 3 days ago without improvement. She has some mild dysuria but no increased frequency, abdominal pain/suprapubic pain, hematuria. No nausea, vomiting, or diarrhea. No fever. Patient believes that her symptoms are due to not taking the full course of Flagyl after being diagnosed with bacterial vaginosis at last visit. Patient does not have a gynecologist. She has one partner and feels that she is low risk for sexually transmitted infection.  Patient is a 28 y.o. female presenting with vaginal discharge. The history is provided by the patient.  Vaginal Discharge Associated symptoms: dyspareunia and dysuria   Associated symptoms: no abdominal pain, no fever, no nausea and no vomiting     History reviewed. No pertinent past medical history. Past Surgical History  Procedure Laterality Date  . Appendectomy     History reviewed. No pertinent family history. History  Substance Use Topics  . Smoking status: Never Smoker   . Smokeless tobacco: Not on file  . Alcohol Use: No   OB History    No data available     Review of Systems  Constitutional: Negative for fever.  HENT: Negative for rhinorrhea and sore throat.   Eyes: Negative for redness.  Respiratory: Negative for cough.   Cardiovascular: Negative for chest pain.  Gastrointestinal: Negative for nausea, vomiting, abdominal pain and diarrhea.  Genitourinary: Positive  for dysuria, vaginal pain and dyspareunia. Negative for decreased urine volume, vaginal bleeding, vaginal discharge and difficulty urinating.  Musculoskeletal: Negative for myalgias.  Skin: Negative for rash.  Neurological: Negative for headaches.    Allergies  Review of patient's allergies indicates no known allergies.  Home Medications   Prior to Admission medications   Medication Sig Start Date End Date Taking? Authorizing Provider  ibuprofen (ADVIL,MOTRIN) 800 MG tablet Take 1 tablet (800 mg total) by mouth 3 (three) times daily. 12/16/13   [name], MD  levonorgestrel (PLAN B) 0.75 MG tablet Take 1 tablet (0.75 mg total) by mouth every 12 (twelve) hours. 05/01/14   Robyn M Hess, PA-C  metroNIDAZOLE (FLAGYL) 500 MG tablet Take 1 tablet (500 mg total) by mouth 2 (two) times daily. One po bid x 7 days 05/01/14   Robyn M Hess, PA-C   BP 139/80 mmHg  Pulse 94  Temp(Src) 98.2 F (36.8 C)  Ht 5\' 7"  (1.702 m)  Wt 210 lb (95.255 kg)  BMI 32.88 kg/m2  SpO2 100%  LMP 05/25/2014   Physical Exam  Constitutional: She appears well-developed and well-nourished.  HENT:  Head: Normocephalic and atraumatic.  Eyes: Conjunctivae are normal. Right eye exhibits no discharge. Left eye exhibits no discharge.  Neck: Normal range of motion. Neck supple.  Cardiovascular: Normal rate, regular rhythm and normal heart sounds.   Pulmonary/Chest: Effort normal and breath sounds normal.  Abdominal: Soft. There is no tenderness.  Genitourinary: There is no rash or tenderness on the right labia. There is no rash or tenderness on the left labia. Uterus is not tender. Cervix  exhibits no motion tenderness and no discharge. Right adnexum displays no mass and no tenderness. Left adnexum displays no mass and no tenderness. There is erythema and tenderness in the vagina. No bleeding in the vagina. No signs of injury around the vagina. Vaginal discharge (white) found.  Neurological: She is alert.  Skin: Skin is  warm and dry.  Psychiatric: She has a normal mood and affect.  Nursing note and vitals reviewed.   ED Course  Procedures (including critical care time) Labs Review Labs Reviewed  WET PREP, GENITAL - Abnormal; Notable for the following:    Yeast Wet Prep HPF POC FEW (*)    Clue Cells Wet Prep HPF POC FEW (*)    WBC, Wet Prep HPF POC FEW (*)    All other components within normal limits  URINALYSIS, ROUTINE W REFLEX MICROSCOPIC - Abnormal; Notable for the following:    pH 8.5 (*)    All other components within normal limits  GC/CHLAMYDIA PROBE AMP  PREGNANCY, URINE    Imaging Review No results found.   EKG Interpretation None      2:27 PM Patient seen and examined. Work-up initiated. Will perform pelvic exam.    Vital signs reviewed and are as follows: BP 139/80 mmHg  Pulse 94  Temp(Src) 98.2 F (36.8 C)  Ht 5\' 7"  (1.702 m)  Wt 210 lb (95.255 kg)  BMI 32.88 kg/m2  SpO2 100%  LMP 05/25/2014  3:01 PM Pelvic exam performed by Brixius PA-S with chaperone.   4:04 PM Wet prep suggests vaginal candidiasis. Patient given oral Diflucan in emergency department. Also with few clue cells. Given her vaginal discharge noted on exam, will treat.  Patient counseled on using nonirritating skin products as well.  Encourage follow-up with a gynecologist. Patient urged to return with worsening symptoms or other concerns. Patient verbalized understanding and agrees with plan.    MDM   Final diagnoses:  Vulvovaginal candidiasis  Bacterial vaginosis   Patient with s/s consistent with vaginitis. No true pelvic pain. No adnexal pain, discharge or cervical motion tenderness to suggest PID.    Renne CriglerJoshua Nolan Lasser, PA-C 05/27/14 1606  Enid SkeensJoshua M Zavitz, MD 05/28/14 [phone]

## 2014-05-27 NOTE — ED Notes (Addendum)
Pt c/o vaginal irritation x 2 days  Seen here 11/10 DX BV didn't complete  ABX

## 2014-05-27 NOTE — Discharge Instructions (Signed)
Please read and follow all provided instructions.  Your diagnoses today include:  1. Vulvovaginal candidiasis   2. Bacterial vaginosis    Tests performed today include:  Vital signs. See below for your results today.   Medications prescribed:   Metronidazole - antibiotic  You have been prescribed an antibiotic medicine: take the entire course of medicine even if you are feeling better. Stopping early can cause the antibiotic not to work. Do not drink alcohol when taking this medication.  Take any prescribed medications only as directed.  Home care instructions:  Follow any educational materials contained in this packet.  Follow-up instructions: Please follow-up with your primary care provider in the next 3 days for further evaluation of your symptoms.   Return instructions:   Please return to the Emergency Department if you experience worsening symptoms.   Please return if you have any other emergent concerns.  Additional Information:  Your vital signs today were: BP 139/80 mmHg   Pulse 94   Temp(Src) 98.2 F (36.8 C)   Ht 5\' 7"  (1.702 m)   Wt 210 lb (95.255 kg)   BMI 32.88 kg/m2   SpO2 100%   LMP 05/25/2014 If your blood pressure (BP) was elevated above 135/85 this visit, please have this repeated by your doctor within one month. --------------

## 2014-05-28 LAB — GC/CHLAMYDIA PROBE AMP
CT Probe RNA: NEGATIVE
GC Probe RNA: NEGATIVE

## 2014-10-05 ENCOUNTER — Emergency Department (HOSPITAL_BASED_OUTPATIENT_CLINIC_OR_DEPARTMENT_OTHER)
Admission: EM | Admit: 2014-10-05 | Discharge: 2014-10-05 | Disposition: A | Discharge status: Home / Self Care | Payer: Managed Care, Other (non HMO) | Attending: Emergency Medicine | Admitting: Emergency Medicine

## 2014-10-05 ENCOUNTER — Encounter (HOSPITAL_BASED_OUTPATIENT_CLINIC_OR_DEPARTMENT_OTHER): Payer: Self-pay

## 2014-10-05 DIAGNOSIS — Z791 Long term (current) use of non-steroidal anti-inflammatories (NSAID): Secondary | ICD-10-CM | POA: Insufficient documentation

## 2014-10-05 DIAGNOSIS — Z79899 Other long term (current) drug therapy: Secondary | ICD-10-CM | POA: Diagnosis not present

## 2014-10-05 DIAGNOSIS — O210 Mild hyperemesis gravidarum: Secondary | ICD-10-CM | POA: Insufficient documentation

## 2014-10-05 DIAGNOSIS — R110 Nausea: Secondary | ICD-10-CM

## 2014-10-05 DIAGNOSIS — Z3A08 8 weeks gestation of pregnancy: Secondary | ICD-10-CM | POA: Diagnosis not present

## 2014-10-05 DIAGNOSIS — Z3490 Encounter for supervision of normal pregnancy, unspecified, unspecified trimester: Secondary | ICD-10-CM

## 2014-10-05 LAB — URINALYSIS, ROUTINE W REFLEX MICROSCOPIC
BILIRUBIN URINE: NEGATIVE
Glucose, UA: NEGATIVE mg/dL
HGB URINE DIPSTICK: NEGATIVE
Ketones, ur: NEGATIVE mg/dL
Leukocytes, UA: NEGATIVE
Nitrite: NEGATIVE
Protein, ur: NEGATIVE mg/dL
SPECIFIC GRAVITY, URINE: 1.023 (ref 1.005–1.030)
Urobilinogen, UA: 0.2 mg/dL (ref 0.0–1.0)
pH: 6 (ref 5.0–8.0)

## 2014-10-05 LAB — PREGNANCY, URINE: PREG TEST UR: POSITIVE — AB

## 2014-10-05 MED ORDER — ONDANSETRON 4 MG PO TBDP
4.0000 mg | ORAL_TABLET | Freq: Once | ORAL | Status: AC
  Administered 2014-10-05: 4 mg via ORAL
  Filled 2014-10-05: qty 1

## 2014-10-05 MED ORDER — ONDANSETRON 4 MG PO TBDP: 4.0000 mg | ORAL_TABLET | Freq: Three times a day (TID) | ORAL | Status: DC | PRN

## 2014-10-05 NOTE — ED Notes (Signed)
Reports 5-6 episodes of emesis a day. Reports G2P1A0.

## 2014-10-05 NOTE — ED Notes (Signed)
Pt laughing on phone during vital signs

## 2014-10-05 NOTE — ED Notes (Signed)
Reports emesis x 2-3 days. Reports she is approximately [redacted] weeks pregnant. No OB at this time. Denies pain.

## 2014-10-05 NOTE — ED Provider Notes (Signed)
CSN: [id number]     Arrival date & time 10/05/14  9562 History   First MD Initiated Contact with Patient 10/05/14 [phone]     Chief Complaint  Patient presents with  . Emesis During Pregnancy      HPI  Position evaluation of nausea and vomiting. Currently pregnant [redacted] weeks by date. Had a confirmation of her pregnancy at health department a week ago. No pain no bleeding no cramping. "Morning sickness" with "symptoms lasting all day". Totally worse in the morning does improve but persistently vomited last evening. No episodes this morning. Additionally for hematuria. No pain or cramping. No bleeding. No discharge.  History reviewed. No pertinent past medical history. Past Surgical History  Procedure Laterality Date  . Appendectomy     No family history on file. History  Substance Use Topics  . Smoking status: Never Smoker   . Smokeless tobacco: Not on file  . Alcohol Use: No   OB History    Gravida Para Term Preterm AB TAB SAB Ectopic Multiple Living   1              Review of Systems  Constitutional: Negative for fever, chills, diaphoresis, appetite change and fatigue.  HENT: Negative for mouth sores, sore throat and trouble swallowing.   Eyes: Negative for visual disturbance.  Respiratory: Negative for cough, chest tightness, shortness of breath and wheezing.   Cardiovascular: Negative for chest pain.  Gastrointestinal: Positive for nausea and vomiting. Negative for abdominal pain, diarrhea and abdominal distention.  Endocrine: Negative for polydipsia, polyphagia and polyuria.  Genitourinary: Negative for dysuria, frequency and hematuria.  Musculoskeletal: Negative for gait problem.  Skin: Negative for color change, pallor and rash.  Neurological: Negative for dizziness, syncope, light-headedness and headaches.  Hematological: Does not bruise/bleed easily.  Psychiatric/Behavioral: Negative for behavioral problems and confusion.      Allergies  Review of patient's  allergies indicates no known allergies.  Home Medications   Prior to Admission medications   Medication Sig Start Date End Date Taking? Authorizing Provider  ibuprofen (ADVIL,MOTRIN) 800 MG tablet Take 1 tablet (800 mg total) by mouth 3 (three) times daily. 12/16/13   Glynn Octave, MD  levonorgestrel (PLAN B) 0.75 MG tablet Take 1 tablet (0.75 mg total) by mouth every 12 (twelve) hours. 05/01/14   Robyn M Hess, PA-C  metroNIDAZOLE (FLAGYL) 500 MG tablet Take 1 tablet (500 mg total) by mouth 2 (two) times daily. One po bid x 7 days 05/27/14   Renne Crigler, PA-C  ondansetron (ZOFRAN ODT) 4 MG disintegrating tablet Take 1 tablet (4 mg total) by mouth every 8 (eight) hours as needed for nausea. 10/05/14   Rolland Porter, MD   BP 120/62 mmHg  Pulse 82  Temp(Src) 98.1 F (36.7 C) (Oral)  Resp 18  Ht  (1.676 m)  Wt 210 lb (95.255 kg)  BMI 33.91 kg/m2  SpO2 100%  LMP 05/25/2014 Physical Exam  Constitutional: She is oriented to person, place, and time. She appears well-developed and well-nourished. No distress.  HENT:  Head: Normocephalic.  Eyes: Conjunctivae are normal. Pupils are equal, round, and reactive to light. No scleral icterus.  Neck: Normal range of motion. Neck supple. No thyromegaly present.  Cardiovascular: Normal rate and regular rhythm.  Exam reveals no gallop and no friction rub.   No murmur heard. Pulmonary/Chest: Effort normal and breath sounds normal. No respiratory distress. She has no wheezes. She has no rales.  Abdominal: Soft. Bowel sounds are normal. She exhibits no  distension. There is no tenderness. There is no rebound.  Musculoskeletal: Normal range of motion.  Neurological: She is alert and oriented to person, place, and time.  Skin: Skin is warm and dry. No rash noted.  Psychiatric: She has a normal mood and affect. Her behavior is normal.    ED Course  Procedures (including critical care time) Labs Review Labs Reviewed  PREGNANCY, URINE  URINALYSIS,  ROUTINE W REFLEX MICROSCOPIC    Imaging Review No results found.   EKG Interpretation None      MDM   Final diagnoses:  Nausea  Pregnancy    Normal exam. Not tachycardic. Moist mucous membranes. No emesis this morning and tolerating some by mouth liquids. Given Zofran dose and prescription. keep your appointments with OB/GYN/ health department.    Rolland PorterMark Ezri Landers, MD 10/05/14 [phone]

## 2014-10-05 NOTE — Discharge Instructions (Signed)

## 2014-10-18 LAB — OB RESULTS CONSOLE GC/CHLAMYDIA
Chlamydia: NEGATIVE
GC PROBE AMP, GENITAL: NEGATIVE

## 2014-10-18 LAB — OB RESULTS CONSOLE RUBELLA ANTIBODY, IGM: RUBELLA: IMMUNE

## 2014-10-18 LAB — OB RESULTS CONSOLE HGB/HCT, BLOOD
HCT: 42 %
HEMOGLOBIN: 13.4 g/dL

## 2014-10-18 LAB — OB RESULTS CONSOLE ABO/RH: RH Type: POSITIVE

## 2014-10-18 LAB — OB RESULTS CONSOLE HIV ANTIBODY (ROUTINE TESTING): HIV: NONREACTIVE

## 2014-10-18 LAB — OB RESULTS CONSOLE ANTIBODY SCREEN: ANTIBODY SCREEN: NEGATIVE

## 2014-10-18 LAB — OB RESULTS CONSOLE PLATELET COUNT: PLATELETS: 373 10*3/uL

## 2014-10-18 LAB — OB RESULTS CONSOLE HEPATITIS B SURFACE ANTIGEN: Hepatitis B Surface Ag: NEGATIVE

## 2014-10-18 LAB — OB RESULTS CONSOLE VARICELLA ZOSTER ANTIBODY, IGG: Varicella: IMMUNE

## 2014-10-18 LAB — OB RESULTS CONSOLE RPR: RPR: NONREACTIVE

## 2014-10-19 ENCOUNTER — Other Ambulatory Visit (HOSPITAL_COMMUNITY): Payer: Self-pay | Admitting: Nurse Practitioner

## 2014-10-19 DIAGNOSIS — Z3682 Encounter for antenatal screening for nuchal translucency: Secondary | ICD-10-CM

## 2014-11-05 ENCOUNTER — Ambulatory Visit (HOSPITAL_COMMUNITY)
Admission: RE | Admit: 2014-11-05 | Discharge: 2014-11-05 | Disposition: A | Discharge status: Home / Self Care | Payer: Managed Care, Other (non HMO) | Source: Ambulatory Visit | Attending: Nurse Practitioner | Admitting: Nurse Practitioner

## 2014-11-05 ENCOUNTER — Encounter (HOSPITAL_COMMUNITY): Payer: Self-pay

## 2014-11-05 DIAGNOSIS — O3421 Maternal care for scar from previous cesarean delivery: Secondary | ICD-10-CM | POA: Diagnosis not present

## 2014-11-05 DIAGNOSIS — Z36 Encounter for antenatal screening of mother: Secondary | ICD-10-CM | POA: Diagnosis not present

## 2014-11-05 DIAGNOSIS — Z3A13 13 weeks gestation of pregnancy: Secondary | ICD-10-CM | POA: Insufficient documentation

## 2014-11-05 DIAGNOSIS — Z3682 Encounter for antenatal screening for nuchal translucency: Secondary | ICD-10-CM | POA: Insufficient documentation

## 2014-11-05 HISTORY — DX: Other specified health status: Z78.9

## 2014-11-07 ENCOUNTER — Other Ambulatory Visit (HOSPITAL_COMMUNITY): Payer: Self-pay | Admitting: Nurse Practitioner

## 2014-11-15 ENCOUNTER — Other Ambulatory Visit (HOSPITAL_COMMUNITY): Payer: Self-pay | Admitting: Nurse Practitioner

## 2014-11-15 DIAGNOSIS — Z3689 Encounter for other specified antenatal screening: Secondary | ICD-10-CM

## 2014-12-14 ENCOUNTER — Ambulatory Visit (HOSPITAL_COMMUNITY)
Admission: RE | Admit: 2014-12-14 | Discharge: 2014-12-14 | Disposition: A | Discharge status: Home / Self Care | Payer: Managed Care, Other (non HMO) | Source: Ambulatory Visit | Attending: Nurse Practitioner | Admitting: Nurse Practitioner

## 2014-12-14 ENCOUNTER — Ambulatory Visit (HOSPITAL_COMMUNITY): Admission: RE | Admit: 2014-12-14 | Payer: Managed Care, Other (non HMO) | Source: Ambulatory Visit

## 2014-12-14 DIAGNOSIS — Z3A19 19 weeks gestation of pregnancy: Secondary | ICD-10-CM | POA: Diagnosis not present

## 2014-12-14 DIAGNOSIS — O3421 Maternal care for scar from previous cesarean delivery: Secondary | ICD-10-CM | POA: Insufficient documentation

## 2014-12-14 DIAGNOSIS — Z36 Encounter for antenatal screening of mother: Secondary | ICD-10-CM | POA: Diagnosis not present

## 2014-12-14 DIAGNOSIS — Z3689 Encounter for other specified antenatal screening: Secondary | ICD-10-CM | POA: Insufficient documentation

## 2014-12-18 ENCOUNTER — Other Ambulatory Visit (HOSPITAL_COMMUNITY): Payer: Self-pay | Admitting: Nurse Practitioner

## 2014-12-18 DIAGNOSIS — Z0489 Encounter for examination and observation for other specified reasons: Secondary | ICD-10-CM

## 2014-12-18 DIAGNOSIS — IMO0002 Reserved for concepts with insufficient information to code with codable children: Secondary | ICD-10-CM

## 2015-01-18 ENCOUNTER — Ambulatory Visit (HOSPITAL_COMMUNITY)
Admission: RE | Admit: 2015-01-18 | Discharge: 2015-01-18 | Disposition: A | Discharge status: Home / Self Care | Payer: Managed Care, Other (non HMO) | Source: Ambulatory Visit | Attending: Nurse Practitioner | Admitting: Nurse Practitioner

## 2015-01-18 DIAGNOSIS — Z0489 Encounter for examination and observation for other specified reasons: Secondary | ICD-10-CM | POA: Insufficient documentation

## 2015-01-18 DIAGNOSIS — Z36 Encounter for antenatal screening of mother: Secondary | ICD-10-CM | POA: Insufficient documentation

## 2015-01-18 DIAGNOSIS — Z3A23 23 weeks gestation of pregnancy: Secondary | ICD-10-CM | POA: Insufficient documentation

## 2015-01-18 DIAGNOSIS — IMO0002 Reserved for concepts with insufficient information to code with codable children: Secondary | ICD-10-CM | POA: Insufficient documentation

## 2015-02-20 LAB — OB RESULTS CONSOLE RPR: RPR: NONREACTIVE

## 2015-02-26 ENCOUNTER — Encounter (HOSPITAL_COMMUNITY): Payer: Self-pay | Admitting: *Deleted

## 2015-02-26 ENCOUNTER — Inpatient Hospital Stay (HOSPITAL_COMMUNITY)
Admission: AD | Admit: 2015-02-26 | Discharge: 2015-02-26 | Disposition: A | Discharge status: Home / Self Care | Payer: Managed Care, Other (non HMO) | Source: Ambulatory Visit | Attending: Obstetrics & Gynecology | Admitting: Obstetrics & Gynecology

## 2015-02-26 DIAGNOSIS — Z3A28 28 weeks gestation of pregnancy: Secondary | ICD-10-CM | POA: Insufficient documentation

## 2015-02-26 DIAGNOSIS — N898 Other specified noninflammatory disorders of vagina: Secondary | ICD-10-CM | POA: Diagnosis present

## 2015-02-26 DIAGNOSIS — O26893 Other specified pregnancy related conditions, third trimester: Secondary | ICD-10-CM | POA: Insufficient documentation

## 2015-02-26 DIAGNOSIS — O4690 Antepartum hemorrhage, unspecified, unspecified trimester: Secondary | ICD-10-CM | POA: Diagnosis not present

## 2015-02-26 LAB — URINALYSIS, ROUTINE W REFLEX MICROSCOPIC
BILIRUBIN URINE: NEGATIVE
Glucose, UA: NEGATIVE mg/dL
HGB URINE DIPSTICK: NEGATIVE
KETONES UR: NEGATIVE mg/dL
Leukocytes, UA: NEGATIVE
NITRITE: NEGATIVE
PROTEIN: NEGATIVE mg/dL
SPECIFIC GRAVITY, URINE: 1.025 (ref 1.005–1.030)
UROBILINOGEN UA: 0.2 mg/dL (ref 0.0–1.0)
pH: 5.5 (ref 5.0–8.0)

## 2015-02-26 LAB — WET PREP, GENITAL
TRICH WET PREP: NONE SEEN
Yeast Wet Prep HPF POC: NONE SEEN

## 2015-02-26 NOTE — MAU Note (Signed)
Not in lobby

## 2015-02-26 NOTE — Progress Notes (Signed)
Wynelle Bourgeois CNM  Notified of pt's arrival and compaints

## 2015-02-26 NOTE — MAU Note (Signed)
Pt reports pinkish discharge. Denies pain.

## 2015-02-26 NOTE — MAU Provider Note (Signed)
History     CSN: [id number]  Arrival date and time: 02/26/15 2001   First Provider Initiated Contact with Patient 02/26/15 2037      No chief complaint on file.  HPI  Patient is 29 y.o. [id number] [redacted]w[redacted]d here with complaints of pink vaginal discharge.  Ms. Tracy Hamilton reports that about two hours ago, she noticed pink discharge on toilet paper after using the bathroom. She denies spotting or bleeding previously in this pregnancy. She said the discharge was pink, not streaky, and mucinous. It did not have a noticeable smell. She denies cramps or abdominal pain. Her last bowel movement was earlier today, and she has not had intercourse recently. She denies contractions, LOF, and endorses FM. She denies dysuria or vaginal itching. Denies any recent falls/injuries.    Past Medical History  Diagnosis Date  . Medical history non-contributory     Past Surgical History  Procedure Laterality Date  . Appendectomy    . Cesarean section      History reviewed. No pertinent family history.  Social History  Substance Use Topics  . Smoking status: Never Smoker   . Smokeless tobacco: None  . Alcohol Use: No    Allergies: No Known Allergies  Prescriptions prior to admission  Medication Sig Dispense Refill Last Dose  . ibuprofen (ADVIL,MOTRIN) 800 MG tablet Take 1 tablet (800 mg total) by mouth 3 (three) times daily. (Patient not taking: Reported on 11/05/2014) 21 tablet 0 Not Taking  . levonorgestrel (PLAN B) 0.75 MG tablet Take 1 tablet (0.75 mg total) by mouth every 12 (twelve) hours. (Patient not taking: Reported on 11/05/2014) 2 tablet 0 Not Taking  . metroNIDAZOLE (FLAGYL) 500 MG tablet Take 1 tablet (500 mg total) by mouth 2 (two) times daily. One po bid x 7 days (Patient not taking: Reported on 11/05/2014) 14 tablet 0 Not Taking  . ondansetron (ZOFRAN ODT) 4 MG disintegrating tablet Take 1 tablet (4 mg total) by mouth every 8 (eight) hours as needed for nausea. (Patient not taking: Reported on  11/05/2014) 16 tablet 0 Not Taking  . Prenatal Vit-Fe Fumarate-FA (PRENATAL VITAMIN PO) Take by mouth.       Review of Systems  Gastrointestinal: Negative for abdominal pain and constipation.  Genitourinary: Negative for dysuria, urgency, frequency and hematuria.  Musculoskeletal: Negative for falls.   Physical Exam   Blood pressure 112/59, pulse 77, temperature 98.6 F (37 C), temperature source Oral, resp. rate 18, height 5\' 7"  (1.702 m), weight 229 lb (103.874 kg), last menstrual period 08/09/2014, SpO2 99 %.  Physical Exam  Constitutional: She is oriented to person, place, and time. She appears well-developed and well-nourished. No distress.  HENT:  Head: Normocephalic and atraumatic.  Cardiovascular: Normal rate and regular rhythm.   Respiratory: Effort normal. No respiratory distress.  GI:  Gravid  Genitourinary:  White mucinous discharge on speculum exam; no blood noted. Cervix closed.   Neurological: She is alert and oriented to person, place, and time. No cranial nerve deficit.  Psychiatric: She has a normal mood and affect. Her behavior is normal.    MAU Course  Procedures None  MDM Vaginal speculum exam: white mucinous discharge, no blood noted Wet prep: few clue cells, few WBCs Irritability seen on strip (but no real contractions)  Urinalysis: no signs of UTI  Assessment and Plan  A: Patient is 29 y.o. G3P1011 [redacted]w[redacted]d reporting pink vaginal discharge. Could be due to irritability, but given no signs of concerning etiology, will discharge.   P: Discharge  home - Reviewed findings and my conclusion - Preterm labor precautions dicussed, esp increased bleeding or appearance of clots  - Handout given - Follow-up with OB provider (next appt scheduled 09/13)   Tarri Abernethy, MD PGY-1 [facility]  02/26/2015, 10:13 PM   Seen and examined by me also Agree with note  Filed Vitals:   02/26/15 2213  BP: 130/69  Pulse: 79  Temp:   Resp:     FHR reassuring Uterine irritability noted, not felt by patient Dilation: Closed Effacement (%): Thick Exam by:: Dr Natale Milch   Discharged home with PTL precautions.  Follow up in clinic for reexamination  Aviva Signs, CNM

## 2015-02-26 NOTE — Discharge Instructions (Signed)
Premature Rupture and Preterm Premature Rupture of Membranes °A sac made up of membranes surrounds your baby in the womb (uterus). When this sac breaks before contractions or labor starts, it is called premature rupture of membranes (PROM). Rupture of membranes is also known as your water breaking. If this happens before 37 weeks, it is called preterm premature rupture of membranes (PPROM). PPROM is serious. It needs medical care right away. °CAUSES  °PROM may be caused by the membranes getting weak. This happens at the end of pregnancy. PPROM is often due to an infection, but can be caused by a number of other things.  °SIGNS OF PROM OR PPROM °· A sudden gush of fluid from the vagina. °· A slow leak of fluid from the vagina. °· Your underwear stay wet. °WHAT TO DO IF YOU THINK YOUR WATER BROKE °Call your doctor right away. You will need to go to the hospital to get checked right away. °WHAT HAPPENS IF YOU ARE TOLD YOU HAVE PROM OR PPROM? °You will have tests done at the hospital. If you have PROM, you may be given medicine to start labor (induced). This may happen if you are not having contractions within 24 hours of your water breaking. If you have PPROM and are not having contractions, you may be given medicine to start labor. It will depend on how far along you are in your pregnancy. °If you have PPROM, you: °· And your baby will be watched closely for signs of infection or other problems. °· May be given an antibiotic medicine. This can stop an infection from starting. °· May be given a steroid medicine. This can help the lungs to develop faster. °· May be given a medicine to stop early labor (preterm labor). °· May be told to stay in bed except to use the restroom (bed rest). °· May be given medicine to start labor. This can happen if there are problems with you or the baby. °Your treatment will depend on many factors. °Document Released: 09/04/2008 Document Revised: 02/08/2013 Document Reviewed:  09/27/2012 °ExitCare® Patient Information ©2015 [employer]. This information is not intended to replace advice given to you by your health care provider. Make sure you discuss any questions you have with your health care provider. ° °

## 2015-04-18 LAB — OB RESULTS CONSOLE GC/CHLAMYDIA
Chlamydia: NEGATIVE
GC PROBE AMP, GENITAL: NEGATIVE

## 2015-04-18 LAB — OB RESULTS CONSOLE GBS: GBS: POSITIVE

## 2015-05-06 ENCOUNTER — Inpatient Hospital Stay (HOSPITAL_COMMUNITY)
Admission: AD | Admit: 2015-05-06 | Discharge: 2015-05-11 | DRG: 766 | Disposition: A | Discharge status: Home / Self Care | Payer: Managed Care, Other (non HMO) | Source: Ambulatory Visit | Attending: Family Medicine | Admitting: Family Medicine

## 2015-05-06 ENCOUNTER — Inpatient Hospital Stay (HOSPITAL_COMMUNITY)
Admission: AD | Admit: 2015-05-06 | Discharge: 2015-05-06 | Disposition: A | Discharge status: Home / Self Care | Payer: Managed Care, Other (non HMO) | Source: Ambulatory Visit | Attending: Obstetrics & Gynecology | Admitting: Obstetrics & Gynecology

## 2015-05-06 ENCOUNTER — Encounter (HOSPITAL_COMMUNITY): Payer: Self-pay | Admitting: *Deleted

## 2015-05-06 DIAGNOSIS — O4593 Premature separation of placenta, unspecified, third trimester: Principal | ICD-10-CM | POA: Diagnosis present

## 2015-05-06 DIAGNOSIS — Z6838 Body mass index (BMI) 38.0-38.9, adult: Secondary | ICD-10-CM

## 2015-05-06 DIAGNOSIS — IMO0001 Reserved for inherently not codable concepts without codable children: Secondary | ICD-10-CM

## 2015-05-06 DIAGNOSIS — O34211 Maternal care for low transverse scar from previous cesarean delivery: Secondary | ICD-10-CM | POA: Diagnosis present

## 2015-05-06 DIAGNOSIS — O99214 Obesity complicating childbirth: Secondary | ICD-10-CM | POA: Diagnosis present

## 2015-05-06 DIAGNOSIS — O99824 Streptococcus B carrier state complicating childbirth: Secondary | ICD-10-CM | POA: Diagnosis present

## 2015-05-06 DIAGNOSIS — Z6839 Body mass index (BMI) 39.0-39.9, adult: Secondary | ICD-10-CM

## 2015-05-06 DIAGNOSIS — Z3A39 39 weeks gestation of pregnancy: Secondary | ICD-10-CM

## 2015-05-06 DIAGNOSIS — O4590 Premature separation of placenta, unspecified, unspecified trimester: Secondary | ICD-10-CM | POA: Diagnosis present

## 2015-05-06 DIAGNOSIS — O4690 Antepartum hemorrhage, unspecified, unspecified trimester: Secondary | ICD-10-CM | POA: Diagnosis present

## 2015-05-06 HISTORY — DX: Acquired absence of other specified parts of digestive tract: Z90.49

## 2015-05-06 LAB — CBC
HCT: 38.7 % (ref 36.0–46.0)
Hemoglobin: 12.9 g/dL (ref 12.0–15.0)
MCH: 29.7 pg (ref 26.0–34.0)
MCHC: 33.3 g/dL (ref 30.0–36.0)
MCV: 89.2 fL (ref 78.0–100.0)
PLATELETS: 279 10*3/uL (ref 150–400)
RBC: 4.34 MIL/uL (ref 3.87–5.11)
RDW: 14.2 % (ref 11.5–15.5)
WBC: 8 10*3/uL (ref 4.0–10.5)

## 2015-05-06 LAB — TYPE AND SCREEN
ABO/RH(D): O POS
ANTIBODY SCREEN: NEGATIVE

## 2015-05-06 MED ORDER — CITRIC ACID-SODIUM CITRATE 334-500 MG/5ML PO SOLN: 30.0000 mL | ORAL | Status: DC | PRN

## 2015-05-06 MED ORDER — BUTORPHANOL TARTRATE 2 MG/ML IJ SOLN
2.0000 mg | INTRAMUSCULAR | Status: DC | PRN
Start: 2015-05-06 — End: 2015-05-11
  Administered 2015-05-06 – 2015-05-07 (×2): 2 mg via INTRAVENOUS
  Filled 2015-05-06 (×2): qty 2

## 2015-05-06 MED ORDER — OXYCODONE-ACETAMINOPHEN 5-325 MG PO TABS: 2.0000 | ORAL_TABLET | ORAL | Status: DC | PRN

## 2015-05-06 MED ORDER — LACTATED RINGERS IV SOLN
500.0000 mL | INTRAVENOUS | Status: DC | PRN
  Administered 2015-05-07 (×2): 500 mL via INTRAVENOUS

## 2015-05-06 MED ORDER — CITRIC ACID-SODIUM CITRATE 334-500 MG/5ML PO SOLN
30.0000 mL | ORAL | Status: DC | PRN
  Administered 2015-05-08: 30 mL via ORAL

## 2015-05-06 MED ORDER — OXYCODONE-ACETAMINOPHEN 5-325 MG PO TABS: 1.0000 | ORAL_TABLET | ORAL | Status: DC | PRN

## 2015-05-06 MED ORDER — OXYTOCIN 40 UNITS IN LACTATED RINGERS INFUSION - SIMPLE MED: 62.5000 mL/h | INTRAVENOUS | Status: DC

## 2015-05-06 MED ORDER — PROMETHAZINE HCL 25 MG/ML IJ SOLN
25.0000 mg | Freq: Once | INTRAMUSCULAR | Status: DC | PRN
  Administered 2015-05-06: 25 mg via INTRAVENOUS
  Filled 2015-05-06: qty 1

## 2015-05-06 MED ORDER — DIPHENHYDRAMINE HCL 50 MG/ML IJ SOLN: 25.0000 mg | Freq: Once | INTRAMUSCULAR | Status: DC | PRN

## 2015-05-06 MED ORDER — OXYTOCIN BOLUS FROM INFUSION: 500.0000 mL | INTRAVENOUS | Status: DC

## 2015-05-06 MED ORDER — LACTATED RINGERS IV SOLN: INTRAVENOUS | Status: DC

## 2015-05-06 MED ORDER — DEXTROSE 5 % IV SOLN: 5.0000 10*6.[IU] | Freq: Once | INTRAVENOUS | Status: DC

## 2015-05-06 MED ORDER — TERBUTALINE SULFATE 1 MG/ML IJ SOLN: 0.2500 mg | Freq: Once | INTRAMUSCULAR | Status: DC | PRN

## 2015-05-06 MED ORDER — LIDOCAINE HCL (PF) 1 % IJ SOLN: 30.0000 mL | INTRAMUSCULAR | Status: DC | PRN

## 2015-05-06 MED ORDER — LACTATED RINGERS IV SOLN
INTRAVENOUS | Status: DC
  Administered 2015-05-07 (×3): via INTRAVENOUS

## 2015-05-06 MED ORDER — ONDANSETRON HCL 4 MG/2ML IJ SOLN: 4.0000 mg | Freq: Four times a day (QID) | INTRAMUSCULAR | Status: DC | PRN

## 2015-05-06 MED ORDER — LACTATED RINGERS IV SOLN: 500.0000 mL | INTRAVENOUS | Status: DC | PRN

## 2015-05-06 MED ORDER — PENICILLIN G POTASSIUM 5000000 UNITS IJ SOLR: 2.5000 10*6.[IU] | INTRAMUSCULAR | Status: DC

## 2015-05-06 MED ORDER — OXYTOCIN 40 UNITS IN LACTATED RINGERS INFUSION - SIMPLE MED
1.0000 m[IU]/min | INTRAVENOUS | Status: DC
  Administered 2015-05-06: 2 m[IU]/min via INTRAVENOUS
  Administered 2015-05-06: 1 m[IU]/min via INTRAVENOUS
  Administered 2015-05-07: 3 m[IU]/min via INTRAVENOUS
  Filled 2015-05-06: qty 1000

## 2015-05-06 MED ORDER — ACETAMINOPHEN 325 MG PO TABS: 650.0000 mg | ORAL_TABLET | ORAL | Status: DC | PRN

## 2015-05-06 NOTE — Progress Notes (Signed)
States she was examined in MAU around 0700. States she noted bright red vaginal bleeding that started around 1300. Passed some clots about an hour ago. Contractions off and on, worse last 2-3 hours.

## 2015-05-06 NOTE — MAU Note (Signed)
Pt c/o contractions that were every 4 mins that started around 4am. States that they have spaced out some now that shes here. Rates 9/10. Denies LOF or vag bleeding. Having pink mucousy discharge. +FM. Cervix was closed last week.

## 2015-05-06 NOTE — MAU Note (Signed)
C/o ucs since 0400 this AM; denies SROM; noticed some pinkish spotting; desires to VBAC;

## 2015-05-06 NOTE — Discharge Instructions (Signed)
Vaginal Birth After Cesarean Delivery  Vaginal birth after cesarean delivery (VBAC) is giving birth vaginally after previously delivering a baby by a cesarean. In the past, if a woman had a cesarean delivery, all births afterward would be done by cesarean delivery. This is no longer true. It can be safe for the mother to try a vaginal delivery after having a cesarean delivery.   It is important to discuss VBAC with your health care provider early in the pregnancy so you can understand the risks, benefits, and options. It will give you time to decide what is best in your particular case. The final decision about whether to have a VBAC or repeat cesarean delivery should be between you and your health care provider. Any changes in your health or your baby's health during your pregnancy may make it necessary to change your initial decision about VBAC.   WOMEN WHO PLAN TO HAVE A VBAC SHOULD CHECK WITH THEIR HEALTH CARE PROVIDER TO BE SURE THAT:  · The previous cesarean delivery was done with a low transverse uterine cut (incision) (not a vertical classical incision).    · The birth canal is big enough for the baby.    · There were no other operations on the uterus.    · An electronic fetal monitor (EFM) will be on at all times during labor.    · An operating room will be available and ready in case an emergency cesarean delivery is needed.    · A health care provider and surgical nursing staff will be available at all times during labor to be ready to do an emergency delivery cesarean if necessary.    · An anesthesiologist will be present in case an emergency cesarean delivery is needed.    · The nursery is prepared and has adequate personnel and necessary equipment available to care for the baby in case of an emergency cesarean delivery.  BENEFITS OF VBAC  · Shorter stay in the hospital.    · Avoidance of risks associated with cesarean delivery, such as:    Surgical complications, such as opening of the incision or  hernia in the incision.    Injury to other organs.    Fever. This can occur if an infection develops after surgery. It can also occur as a reaction to the medicine given to make you numb during the surgery.  · Less blood loss and need for blood transfusions.  · Lower risk of blood clots and infection.   · Shorter recovery.    · Decreased risk for having to remove the uterus (hysterectomy).    · Decreased risk for the placenta to completely or partially cover the opening of the uterus (placenta previa) with a future pregnancy.    · Decrease risk in future labor and delivery.  RISKS OF A VBAC  · Tearing (rupture) of the uterus. This is occurs in less than 1% of VBACs. The risk of this happening is higher if:    Steps are taken to begin the labor process (induce labor) or stimulate or strengthen contractions (augment labor).      Medicine is used to soften (ripen) the cervix.  · Having to remove the uterus (hysterectomy) if it ruptures.  VBAC SHOULD NOT BE DONE IF:  · The previous cesarean delivery was done with a vertical (classical) or T-shaped incision or you do not know what kind of incision was made.    · You had a ruptured uterus.    · You have had certain types of surgery on your uterus, such as removal of uterine fibroids.   Ask your health care provider about other types of surgeries that prevent you from having a VBAC.  · You have certain medical or childbirth (obstetrical) problems.    · There are problems with the baby.    · You have had two previous cesarean deliveries and no vaginal deliveries.  OTHER FACTS TO KNOW ABOUT VBAC:  · It is safe to have an epidural anesthetic with VBAC.    · It is safe to turn the baby from a breech position (attempt an external cephalic version).    · It is safe to try a VBAC with twins.    · VBAC may not be successful if your baby weights 8.8 lb (4 kg) or more. However, weight predictions are not always accurate and should not be used alone to decide if VBAC is right for  you.  · There is an increased failure rate if the time between the cesarean delivery and VBAC is less than 19 months.    · Your health care provider may advise against a VBAC if you have preeclampsia (high blood pressure, protein in the urine, and swelling of face and extremities).    · VBAC is often successful if you previously gave birth vaginally.    · VBAC is often successful when the labor starts spontaneously before the due date.    · Delivering a baby through a VBAC is similar to having a normal spontaneous vaginal delivery.     This information is not intended to replace advice given to you by your health care provider. Make sure you discuss any questions you have with your health care provider.     Document Released: 11/29/2006 Document Revised: 06/29/2014 Document Reviewed: 01/05/2013  Elsevier Interactive Patient Education ©2016 Else[employer] Inc[employer]

## 2015-05-06 NOTE — H&P (Addendum)
Tracy Hamilton is a 29 y.o.G3P1101 @ 39.3 in with c/o vag bleeding with clots and contractions. Denies ROM. Prev LTCS desires TOLAC. GBS pos. Maternal Medical History:  Reason for admission: Contractions and vaginal bleeding.   Contractions: Onset was 6-12 hours ago.   Frequency: irregular.   Perceived severity is mild.    Fetal activity: Perceived fetal activity is normal.   Last perceived fetal movement was within the past hour.      OB History    Gravida Para Term Preterm AB TAB SAB Ectopic Multiple Living   3 1 1  1 1    1      Past Medical History  Diagnosis Date  . Medical history non-contributory    Past Surgical History  Procedure Laterality Date  . Appendectomy    . Cesarean section     Family History: family history is negative for Alcohol abuse, Arthritis, Asthma, Birth defects, Cancer, COPD, Depression, Diabetes, Drug abuse, Early death, Hearing loss, Heart disease, Hyperlipidemia, Hypertension, Kidney disease, Learning disabilities, Mental illness, Mental retardation, Miscarriages / Stillbirths, Stroke, Vision loss, and Varicose Veins. Social History:  reports that she has never smoked. She does not have any smokeless tobacco history on file. She reports that she does not drink alcohol or use illicit drugs.   Prenatal Transfer Tool  Maternal Diabetes: No Genetic Screening: Normal Maternal Ultrasounds/Referrals: Normal Fetal Ultrasounds or other Referrals:  None Maternal Substance Abuse:  No Significant Maternal Medications:  None Significant Maternal Lab Results:  Lab values include: Group B Strep positive Other Comments:  None  Review of Systems  Constitutional: Negative.   HENT: Negative.   Eyes: Negative.   Respiratory: Negative.   Cardiovascular: Negative.   Gastrointestinal: Positive for abdominal pain.  Genitourinary: Negative.   Musculoskeletal: Negative.   Skin: Negative.   Neurological: Negative.   Endo/Heme/Allergies: Negative.    Psychiatric/Behavioral: Negative.       Last menstrual period 08/09/2014. Maternal Exam:  Uterine Assessment: Contraction strength is mild.  Contraction frequency is regular.   Abdomen: Patient reports no abdominal tenderness. Surgical scars: low transverse.   Estimated fetal weight is 8lbs.   Fetal presentation: vertex  Introitus: Normal vulva. Normal vagina.  Amniotic fluid character: not assessed. sm-mod amt dark vag bleeding with sm clots  Cervix: Cervix evaluated by digital exam.     Fetal Exam Fetal Monitor Review: Mode: ultrasound.   Variability: moderate (6-25 bpm).    Fetal State Assessment: Category I - tracings are normal.     Physical Exam  Constitutional: She is oriented to person, place, and time. She appears well-developed and well-nourished.  HENT:  Head: Normocephalic.  Eyes: Pupils are equal, round, and reactive to light.  Neck: Normal range of motion.  Cardiovascular: Normal rate, regular rhythm, normal heart sounds and intact distal pulses.   Respiratory: Effort normal and breath sounds normal.  GI: Soft. Bowel sounds are normal.  Genitourinary: Vagina normal and uterus normal.  Musculoskeletal: Normal range of motion.  Neurological: She is alert and oriented to person, place, and time. She has normal reflexes.  Skin: Skin is warm and dry.  Psychiatric: She has a normal mood and affect. Her behavior is normal. Judgment and thought content normal.    Prenatal labs: ABO, Rh:   Antibody:   Rubella:   RPR:    HBsAg:    HIV:    GBS: Positive (10/27 0000)   Assessment/Plan: SVE ft/th/post/high sm-mod amt dark bleeding with sm clots noted with exam. FHR patter stable  at present. Will consult Dr. Otilio Miu, MARIE DARLENE 05/06/2015, 5:27 PM   Attestation of Attending Supervision of Advanced Practitioner (PA/CNM/NP): Evaluation and management procedures were performed by the Advanced Practitioner under my supervision and collaboration.  I  have reviewed the Advanced Practitioner's note and chart, and I agree with the management and plan.  Candelaria Celeste, DO Attending Physician Faculty Practice, [facility] [place]

## 2015-05-07 ENCOUNTER — Inpatient Hospital Stay (HOSPITAL_COMMUNITY): Payer: Managed Care, Other (non HMO) | Admitting: Anesthesiology

## 2015-05-07 LAB — RPR: RPR Ser Ql: NONREACTIVE

## 2015-05-07 LAB — ABO/RH: ABO/RH(D): O POS

## 2015-05-07 MED ORDER — FENTANYL 2.5 MCG/ML BUPIVACAINE 1/10 % EPIDURAL INFUSION (WH - ANES)
14.0000 mL/h | INTRAMUSCULAR | Status: DC | PRN
  Administered 2015-05-07 (×4): 14 mL/h via EPIDURAL
  Filled 2015-05-07 (×3): qty 125

## 2015-05-07 MED ORDER — PENICILLIN G POTASSIUM 5000000 UNITS IJ SOLR
2.5000 10*6.[IU] | INTRAVENOUS | Status: DC
  Administered 2015-05-07 (×4): 2.5 10*6.[IU] via INTRAVENOUS
  Filled 2015-05-07 (×9): qty 2.5

## 2015-05-07 MED ORDER — OXYTOCIN 40 UNITS IN LACTATED RINGERS INFUSION - SIMPLE MED
1.0000 m[IU]/min | INTRAVENOUS | Status: DC
  Administered 2015-05-07: 2 m[IU]/min via INTRAVENOUS
  Administered 2015-05-07: 18 m[IU]/min via INTRAVENOUS
  Administered 2015-05-07: 8 m[IU]/min via INTRAVENOUS

## 2015-05-07 MED ORDER — PENICILLIN G POTASSIUM 5000000 UNITS IJ SOLR: 5.0000 10*6.[IU] | Freq: Once | INTRAMUSCULAR | Status: DC

## 2015-05-07 MED ORDER — DIPHENHYDRAMINE HCL 50 MG/ML IJ SOLN: 12.5000 mg | INTRAMUSCULAR | Status: DC | PRN

## 2015-05-07 MED ORDER — PENICILLIN G POTASSIUM 5000000 UNITS IJ SOLR: 5.0000 10*6.[IU] | Freq: Once | INTRAVENOUS | Status: DC

## 2015-05-07 MED ORDER — PHENYLEPHRINE 40 MCG/ML (10ML) SYRINGE FOR IV PUSH (FOR BLOOD PRESSURE SUPPORT)
80.0000 ug | PREFILLED_SYRINGE | INTRAVENOUS | Status: AC | PRN
  Administered 2015-05-08: 40 ug via INTRAVENOUS
  Administered 2015-05-08: 80 ug via INTRAVENOUS
  Administered 2015-05-08: 40 ug via INTRAVENOUS
  Filled 2015-05-07 (×2): qty 20

## 2015-05-07 MED ORDER — PENICILLIN G POTASSIUM 5000000 UNITS IJ SOLR
5.0000 10*6.[IU] | Freq: Once | INTRAVENOUS | Status: AC
  Administered 2015-05-07: 5 10*6.[IU] via INTRAVENOUS
  Filled 2015-05-07: qty 5

## 2015-05-07 MED ORDER — EPHEDRINE 5 MG/ML INJ: 10.0000 mg | INTRAVENOUS | Status: DC | PRN

## 2015-05-07 MED ORDER — LIDOCAINE HCL (PF) 1 % IJ SOLN
INTRAMUSCULAR | Status: DC | PRN
  Administered 2015-05-07: 4 mL via EPIDURAL
  Administered 2015-05-07: 2 mL via EPIDURAL
  Administered 2015-05-07: 4 mL
  Administered 2015-05-07: 4 mL via EPIDURAL
  Administered 2015-05-07: 4 mL

## 2015-05-07 MED ORDER — PENICILLIN G POTASSIUM 5000000 UNITS IJ SOLR: 2.5000 10*6.[IU] | INTRAVENOUS | Status: DC

## 2015-05-07 NOTE — Progress Notes (Signed)
Uvaldo BristleShameka Borror is a 29 y.o. G3P1011 at 5456w4d admitted for active labor, history of LTCS for nonreassuring fetal surveillance.   Subjective: Pt comfortable with epidural.  S/O in room for support.  Objective: BP 124/57 mmHg  Pulse 77  Temp(Src) 97.9 F (36.6 C) (Oral)  Resp 20  Ht 5\' 6"  (1.676 m)  Wt 109.317 kg (241 lb)  BMI 38.92 kg/m2  SpO2 100%  LMP 08/09/2014      FHT:  FHR: 140 bpm, variability: moderate,  accelerations:  Present,  decelerations:  Absent UC:   regular, every 5-6 minutes SVE:   Dilation: 3 Effacement (%): 80 Station: -3 Exam by:: stenson  Labs: Lab Results  Component Value Date   WBC 8.0 05/06/2015   HGB 12.9 05/06/2015   HCT 38.7 05/06/2015   MCV 89.2 05/06/2015   PLT 279 05/06/2015    Assessment / Plan: Augmentation of labor, progressing well  Earlier had fetal intolerance of labor, Pitocin turned off then restarted. Pitocin restarted and FHR status improved with inadequate contractions.  Will continue to monitor.  Preeclampsia:  n/a Fetal Wellbeing:  Category I Pain Control:  Epidural I/D:  n/a Anticipated MOD:  undetermined  LEFTWICH-KIRBY, Jean Alejos 05/07/2015, 9:56 AM

## 2015-05-07 NOTE — Progress Notes (Signed)
Assumed care of pt after receiving report. Pt moved to rt lateral, from supine,peanut ball in place. Pt comfortable after second epidural placement.

## 2015-05-07 NOTE — Progress Notes (Signed)
Epidural catheter out  Dr Gentry RochJudd at bedside will replace

## 2015-05-07 NOTE — Progress Notes (Signed)
Pitocin initiated at 2 mu.

## 2015-05-07 NOTE — Progress Notes (Signed)
   Tracy Hamilton is a 29 y.o. G3P1011 at 6388w4d  admitted for induction of labor due to vaginal bleeding/contractions. She had a foley that came out at 0400.  Labor has been adequate since 1730.  Subjective:  Comfortable with epidural Objective: Filed Vitals:   05/07/15 1911 05/07/15 1916 05/07/15 1921 05/07/15 1930  BP: 100/62 118/72 117/61   Pulse: 78 73 75   Temp:    99.1 F (37.3 C)  TempSrc:    Oral  Resp:    20  Height:      Weight:      SpO2:          FHT:  FHR: 140 bpm, variability: moderate,  accelerations:  Present,  decelerations:  Present Has occ late decel, but variability has always been moderate and accels are frequent.  UC:   MVU's 250 SVE:   Dilation: 4 Effacement (%): 80 Station: -2 Exam by:: Drenda FreezeFran CresDishmon Pitocin @ 14 mu/min  Labs: Lab Results  Component Value Date   WBC 8.0 05/06/2015   HGB 12.9 05/06/2015   HCT 38.7 05/06/2015   MCV 89.2 05/06/2015   PLT 279 05/06/2015    Assessment / Plan: no cervical change, adequate labor for 3 hours.  Pt is agreeable to continuing TOLAC for at least a few more hours.    Labor: adequate, no cervical change Fetal Wellbeing:  Category I and Category II Pain Control:  Epidural Anticipated MOD:  hopeful for VBAC  CRESENZO-DISHMAN,Penni Penado 05/07/2015, 8:24 PM

## 2015-05-07 NOTE — Progress Notes (Signed)
Labor Progress Note  Tracy Hamilton is a 29 y.o. G3P1011 at 1350w4d admitted for SOL and TOLAC. Concern for possible placental abruption given initial presentation with vaginal bleeding.   S: Patient was able to rest some overnight with stadol and phenergan.   O:  BP 113/66 mmHg  Pulse 76  Temp(Src) 97.5 F (36.4 C) (Oral)  Resp 16  Ht 5\' 6"  (1.676 m)  Wt 109.317 kg (241 lb)  BMI 38.92 kg/m2  LMP 08/09/2014    FHT:  FHR: 130 bpm, variability: moderate,  accelerations:  Present,  decelerations:  Present Occasional early  UC:   regular, every 3-5 minutes SVE:   Dilation: 3 Effacement (%): 50 Station: -3 Exam by:: B boyer, RN SROM @ 0353: mustard-colored meconium in fluid  Pitocin @ 5 mu/min  Labs: Lab Results  Component Value Date   WBC 8.0 05/06/2015   HGB 12.9 05/06/2015   HCT 38.7 05/06/2015   MCV 89.2 05/06/2015   PLT 279 05/06/2015    Assessment / Plan: 29 y.o. G3P1011 8650w4d in early labor Augmentation of labor, progressing well  Labor: Progressing well after FB placed, SROM and continued pitocin. Fetal Wellbeing:  Category I Pain Control:  Planning on epidural Anticipated MOD:  NSVD  Expectant management  Dani GobbleHillary Fitzgerald, MD [facility], PGY-1

## 2015-05-07 NOTE — Progress Notes (Signed)
Tracy BristleShameka Hamilton is a 29 y.o. G3P1011 at 2242w4d admitted for induction of labor due to marginal abruption.  Subjective: Patient comfortable.  SROM at 3:45.  Had prolonged decel for 2 minutes with recovery with position change.   Has had a couple of late decelerations after.    Objective: BP 120/76 mmHg  Pulse 71  Temp(Src) 98.5 F (36.9 C) (Oral)  Resp 20  Ht 5\' 6"  (1.676 m)  Wt 241 lb (109.317 kg)  BMI 38.92 kg/m2  SpO2 100%  LMP 08/09/2014      FHT:  FHR: 150 bpm, variability: moderate,  accelerations:  Abscent,  decelerations:  Present late UC:   regular, every 7 minutes SVE:   Dilation: 3 Effacement (%): 80 Station: -3 Exam by:: stenson  Labs: Lab Results  Component Value Date   WBC 8.0 05/06/2015   HGB 12.9 05/06/2015   HCT 38.7 05/06/2015   MCV 89.2 05/06/2015   PLT 279 05/06/2015    Assessment / Plan: Foley catheter out at 4am.  Pitocin at 5milliunits.  IUPC placed.  Patient continued to have decelerations - stop pitocin.  Will see how baby recovers. Baby may not tolerate labor and may need to proceed with c/s - this was discussed with patient.  Philipp Callegari JEHIEL 05/07/2015, 7:44 AM

## 2015-05-07 NOTE — Anesthesia Preprocedure Evaluation (Addendum)
Anesthesia Evaluation  Patient identified by MRN, date of birth, ID band Patient awake    Reviewed: Allergy & Precautions, H&P , NPO status , Patient's Chart, lab work & pertinent test results  Airway Mallampati: II  TM Distance: >3 FB Neck ROM: full    Dental no notable dental hx.    Pulmonary neg pulmonary ROS,    Pulmonary exam normal breath sounds clear to auscultation       Cardiovascular negative cardio ROS Normal cardiovascular exam Rhythm:regular Rate:Normal     Neuro/Psych negative neurological ROS  negative psych ROS   GI/Hepatic negative GI ROS, Neg liver ROS,   Endo/Other  Morbid obesity  Renal/GU negative Renal ROS  negative genitourinary   Musculoskeletal   Abdominal   Peds  Hematology negative hematology ROS (+)   Anesthesia Other Findings Pregnancy - pt is TOLAC  Platelets and allergies reviewed Denies active cardiac or pulmonary symptoms, METS > 4  Denies blood thinning medications, bleeding disorders, hypertension, asthma, supine hypotension syndrome, previous anesthesia difficulties    Reproductive/Obstetrics (+) Pregnancy                            Anesthesia Physical Anesthesia Plan  ASA: III and emergent  Anesthesia Plan: Epidural   Post-op Pain Management:    Induction:   Airway Management Planned:   Additional Equipment:   Intra-op Plan:   Post-operative Plan:   Informed Consent: I have reviewed the patients History and Physical, chart, labs and discussed the procedure including the risks, benefits and alternatives for the proposed anesthesia with the patient or authorized representative who has indicated his/her understanding and acceptance.   Dental advisory given  Plan Discussed with: CRNA, Anesthesiologist and Surgeon  Anesthesia Plan Comments: (Patient for urgent C/Section for non reassuring FHR tracing. Will use epidural for C/Section. M.  Malen GauzeFoster, MD)      Anesthesia Quick Evaluation

## 2015-05-07 NOTE — Progress Notes (Signed)
Tracy Hamilton is a 29 y.o. G3P1011 at 1362w4d admitted for active labor TOLAC  Subjective: Pt reports feeling more cramping/contractions in her lower abdomen. Pain is intermittent, with contractions.  Objective: BP 136/71 mmHg  Pulse 80  Temp(Src) 98.1 F (36.7 C) (Axillary)  Resp 18  Ht 5\' 6"  (1.676 m)  Wt 109.317 kg (241 lb)  BMI 38.92 kg/m2  SpO2 100%  LMP 08/09/2014   Total I/O In: -  Out: 1600 [Urine:1600]  FHT:  FHR: 140 bpm, variability: moderate,  accelerations:  Present,  decelerations:  Present variables, resolved with position change UC:   regular, every 3-4 minutes, MVUs 180 SVE:   Dilation: 4 Effacement (%): 80 Station: -2 Exam by:: Leftwich_Kirby  Labs: Lab Results  Component Value Date   WBC 8.0 05/06/2015   HGB 12.9 05/06/2015   HCT 38.7 05/06/2015   MCV 89.2 05/06/2015   PLT 279 05/06/2015    Assessment / Plan: Augmentation of labor, progressing well  Labor: No cervical change, plan to increase Pitocin as FHR tolerates,RN to reposition pt to encourage fetal descent, give preset dose for epidural to reduce pain.  RN to notify CNM if pain persists. Preeclampsia:  n/a Fetal Wellbeing:  Category II Pain Control:  Epidural I/D:  n/a Anticipated MOD:  NSVD  LEFTWICH-KIRBY, Wyeth Hoffer 05/07/2015, 5:59 PM

## 2015-05-07 NOTE — Progress Notes (Signed)
Pitocin restarted at 2211.  Now FHR with prolonged decel, cx still 5/80/-2; ctx q 2-4 minutes.  Dr Jolayne PantherOnstant notified.  Pit off/02 on. RLTCS for NRFHT, and failed IOL.

## 2015-05-07 NOTE — Progress Notes (Signed)
Tracy Hamilton is a 29 y.o. G3P1011 at 7247w4d admitted for active labor with hx of LTCS.  Subjective: Pt comfortable with epidural. Pitocin off related to FHR tracing.  Objective: BP 130/79 mmHg  Pulse 71  Temp(Src) 97.8 F (36.6 C) (Oral)  Resp 20  Ht 5\' 6"  (1.676 m)  Wt 109.317 kg (241 lb)  BMI 38.92 kg/m2  SpO2 100%  LMP 08/09/2014   Total I/O In: -  Out: 1600 [Urine:1600]  FHT:  FHR: 130 bpm, variability: moderate,  accelerations:  Present,  decelerations:  Absent UC:   irregular, rare SVE:   Dilation: 4 Effacement (%): 80 Station: -2 Exam by:: Leggett IUPC replaced by Sharen CounterLisa Leftwich-Kirby, CNM, at 10 o'clock position.  Pt tolerated well.   Labs: Lab Results  Component Value Date   WBC 8.0 05/06/2015   HGB 12.9 05/06/2015   HCT 38.7 05/06/2015   MCV 89.2 05/06/2015   PLT 279 05/06/2015    Assessment / Plan: Augmentation of labor, progressing well  Pitocin off related to FHR tracing  Labor: Dr Penne LashLeggett evaluated FHR tracing, checked pt cervix.  Pt desires TOLAC.  Plan to restart Pitocin at 2 milliunits/min and increase by 2 per protocol.  Will continue to monitor.  Preeclampsia:  n/a Fetal Wellbeing:  Category I Pain Control:  Epidural I/D:  n/a Anticipated MOD:  NSVD  LEFTWICH-KIRBY, Balin Vandegrift 05/07/2015, 3:14 PM

## 2015-05-07 NOTE — Plan of Care (Signed)
Problem: Education: Goal: Knowledge of Childbirth will improve Outcome: Progressing Continue to allow for successful TOLAC at this time.

## 2015-05-07 NOTE — Progress Notes (Signed)
Patient ID: Tracy Hamilton, female   DOB: 08/18/1985, 29 y.o.   MRN: [id number]   Patient had a 2 minute deceleration just prior to 1 pm.  The pitocin was discontinued and the tracing returned to baseline with good variability.  Patient had just reached adequate contractions just as pitocin was discontinued.  Cervix 4/80 and soft.  Head well applied.  Patient would like to continue VBAC attempt.  Will restart pitocin at 2 milliunit.  IUPC will need to be replaced as RN says it is dislodged.

## 2015-05-07 NOTE — Progress Notes (Signed)
Tracy Hamilton is a 29 y.o. G3P1011 at 3773w4d admitted for active labor  Subjective: Pt comfortable with epidural  Objective: BP 100/60 mmHg  Pulse 78  Temp(Src) 97.7 F (36.5 C) (Axillary)  Resp 20  Ht 5\' 6"  (1.676 m)  Wt 109.317 kg (241 lb)  BMI 38.92 kg/m2  SpO2 100%  LMP 08/09/2014      FHT:  FHR: 135 bpm, variability: moderate,  accelerations:  Present,  decelerations:  Present repetitive variables followed by prolonged decels x 2 with return to baseline and resolution of decels after position change, Pitocin off UC:   regular, every 3 minutes SVE:   Dilation: 4 Effacement (%): 80 Station: -2 Exam by:: leftwich-kirby  Labs: Lab Results  Component Value Date   WBC 8.0 05/06/2015   HGB 12.9 05/06/2015   HCT 38.7 05/06/2015   MCV 89.2 05/06/2015   PLT 279 05/06/2015    Assessment / Plan: Augmentation of labor  Inadequate contractions without cervical change  Labor: Ctx inadequate with FHR changes and Pitocin off Preeclampsia:  n/a Fetal Wellbeing:  Category II Pain Control:  Epidural I/D:  n/a Anticipated MOD:  undecided  LEFTWICH-KIRBY, Tracy Hamilton 05/07/2015, 1:10 PM

## 2015-05-07 NOTE — Anesthesia Procedure Notes (Addendum)
Epidural Patient location during procedure: OB  Staffing Anesthesiologist: Vasil Juhasz, BENJAMIN Performed by: anesthesiologist   Preanesthetic Checklist Completed: patient identified, site marked, surgical consent, pre-op evaluation, timeout performed, IV checked, risks and benefits discussed and monitors and equipment checked  Epidural Patient position: sitting Prep: site prepped and draped and DuraPrep Patient monitoring: continuous pulse ox and blood pressure Approach: midline Location: L3-L4 Injection technique: LOR saline  Needle:  Needle type: Tuohy  Needle gauge: 17 G Needle length: 9 cm and 9 Needle insertion depth: 7 cm Catheter type: closed end flexible Catheter size: 19 Gauge Catheter at skin depth: 12 cm Test dose: negative  Assessment Events: blood not aspirated, injection not painful, no injection resistance, negative IV test and no paresthesia  Additional Notes Patient identified. Risks/Benefits/Options discussed with patient including but not limited to bleeding, infection, nerve damage, paralysis, failed block, incomplete pain control, headache, blood pressure changes, nausea, vomiting, reactions to medications, itching and postpartum back pain. Confirmed with bedside nurse the patient's most recent platelet count. Confirmed with patient that they are not currently taking any anticoagulation, have any bleeding history or any family history of bleeding disorders. Patient expressed understanding and wished to proceed. All questions were answered. Sterile technique was used throughout the entire procedure. Please see nursing notes for vital signs. Test dose was given through epidural catheter and negative prior to continuing to dose epidural or start infusion. Warning signs of high block given to the patient including shortness of breath, tingling/numbness in hands, complete motor block, or any concerning symptoms with instructions to call for help. Patient was given  instructions on fall risk and not to get out of bed. All questions and concerns addressed with instructions to call with any issues or inadequate analgesia.  Reason for block:procedure for pain  Epidural Patient location during procedure: OB  Staffing Anesthesiologist: Braulio Kiedrowski Performed by: anesthesiologist   Preanesthetic Checklist Completed: patient identified, site marked, surgical consent, pre-op evaluation, timeout performed, IV checked, risks and benefits discussed and monitors and equipment checked  Epidural Patient position: sitting Prep: site prepped and draped and DuraPrep Patient monitoring: continuous pulse ox and blood pressure Approach: midline Location: L3-L4 Injection technique: LOR saline  Needle:  Needle type: Tuohy  Needle gauge: 17 G Needle length: 9 cm and 9 Needle insertion depth: 7 cm Catheter type: closed end flexible Catheter size: 19 Gauge Catheter at skin depth: 14 cm Test dose: negative  Assessment Events: blood not aspirated, injection not painful, no injection resistance, negative IV test and no paresthesia  Additional Notes Patient identified. Risks/Benefits/Options discussed with patient including but not limited to bleeding, infection, nerve damage, paralysis, failed block, incomplete pain control, headache, blood pressure changes, nausea, vomiting, reactions to medication both or allergic, itching and postpartum back pain. Confirmed with bedside nurse the patient's most recent platelet count. Confirmed with patient that they are not currently taking any anticoagulation, have any bleeding history or any family history of bleeding disorders. Patient expressed understanding and wished to proceed. All questions were answered. Sterile technique was used throughout the entire procedure. Please see nursing notes for vital signs. Test dose was given through epidural catheter and negative prior to continuing to dose epidural or start infusion. Warning  signs of high block given to the patient including shortness of breath, tingling/numbness in hands, complete motor block, or any concerning symptoms with instructions to call for help. Patient was given instructions on fall risk and not to get out of bed. All questions and concerns addressed with instructions  to call with any issues or inadequate analgesia.

## 2015-05-07 NOTE — Progress Notes (Signed)
Pt had 10 minutes of prologed variables/late decels. Variability moderate. Pit d/c'd, O2 and IVF bolus.  Ctx now spaced out, q 6 minutes, decels resolved.  Will restart pitocin around 2200 Cx 5/80/-2

## 2015-05-07 NOTE — Progress Notes (Signed)
Turned pt to left side from rt side, fhr decels noted, turned back to rt side, pitocin off, hob down, iv bolus initiated and O2 placed via nonrebreather, F. Cresdishmon notified.

## 2015-05-08 ENCOUNTER — Encounter (HOSPITAL_COMMUNITY)
Admission: AD | Disposition: A | Discharge status: Home / Self Care | Payer: Self-pay | Source: Ambulatory Visit | Attending: Family Medicine

## 2015-05-08 ENCOUNTER — Encounter (HOSPITAL_COMMUNITY): Payer: Self-pay

## 2015-05-08 DIAGNOSIS — O99824 Streptococcus B carrier state complicating childbirth: Secondary | ICD-10-CM

## 2015-05-08 DIAGNOSIS — Z3A39 39 weeks gestation of pregnancy: Secondary | ICD-10-CM

## 2015-05-08 DIAGNOSIS — O99214 Obesity complicating childbirth: Secondary | ICD-10-CM

## 2015-05-08 DIAGNOSIS — O4593 Premature separation of placenta, unspecified, third trimester: Secondary | ICD-10-CM

## 2015-05-08 SURGERY — Surgical Case
Anesthesia: Epidural | Site: Abdomen

## 2015-05-08 MED ORDER — MEPERIDINE HCL 25 MG/ML IJ SOLN
INTRAMUSCULAR | Status: DC | PRN
  Administered 2015-05-08 (×2): 12.5 mg via INTRAVENOUS

## 2015-05-08 MED ORDER — KETOROLAC TROMETHAMINE 30 MG/ML IJ SOLN
INTRAMUSCULAR | Status: AC
  Filled 2015-05-08: qty 1

## 2015-05-08 MED ORDER — SODIUM BICARBONATE 8.4 % IV SOLN
INTRAVENOUS | Status: DC | PRN
  Administered 2015-05-08 (×2): 5 mL via EPIDURAL

## 2015-05-08 MED ORDER — KETOROLAC TROMETHAMINE 30 MG/ML IJ SOLN
30.0000 mg | Freq: Four times a day (QID) | INTRAMUSCULAR | Status: AC | PRN
  Administered 2015-05-08: 30 mg via INTRAMUSCULAR

## 2015-05-08 MED ORDER — OXYTOCIN 10 UNIT/ML IJ SOLN
40.0000 [IU] | INTRAVENOUS | Status: DC | PRN
  Administered 2015-05-08: 40 [IU] via INTRAVENOUS

## 2015-05-08 MED ORDER — MENTHOL 3 MG MT LOZG: 1.0000 | LOZENGE | OROMUCOSAL | Status: DC | PRN

## 2015-05-08 MED ORDER — SIMETHICONE 80 MG PO CHEW
80.0000 mg | CHEWABLE_TABLET | ORAL | Status: DC
  Administered 2015-05-08 – 2015-05-10 (×3): 80 mg via ORAL
  Filled 2015-05-08 (×3): qty 1

## 2015-05-08 MED ORDER — SODIUM CHLORIDE 0.9 % IJ SOLN: 3.0000 mL | INTRAMUSCULAR | Status: DC | PRN

## 2015-05-08 MED ORDER — ONDANSETRON HCL 4 MG/2ML IJ SOLN
INTRAMUSCULAR | Status: DC | PRN
  Administered 2015-05-08: 4 mg via INTRAVENOUS

## 2015-05-08 MED ORDER — IBUPROFEN 600 MG PO TABS
600.0000 mg | ORAL_TABLET | Freq: Four times a day (QID) | ORAL | Status: DC
  Administered 2015-05-08 – 2015-05-11 (×12): 600 mg via ORAL
  Filled 2015-05-08 (×12): qty 1

## 2015-05-08 MED ORDER — WITCH HAZEL-GLYCERIN EX PADS
1.0000 | MEDICATED_PAD | CUTANEOUS | Status: DC | PRN
Start: 2015-05-08 — End: 2015-05-11

## 2015-05-08 MED ORDER — DIBUCAINE 1 % RE OINT: 1.0000 "application " | TOPICAL_OINTMENT | RECTAL | Status: DC | PRN

## 2015-05-08 MED ORDER — MORPHINE SULFATE (PF) 0.5 MG/ML IJ SOLN
INTRAMUSCULAR | Status: DC | PRN
  Administered 2015-05-08: .5 mg via EPIDURAL
  Administered 2015-05-08: 4 mg via EPIDURAL
  Administered 2015-05-08: .5 mg via EPIDURAL

## 2015-05-08 MED ORDER — SCOPOLAMINE 1 MG/3DAYS TD PT72
MEDICATED_PATCH | TRANSDERMAL | Status: DC | PRN
  Administered 2015-05-08: 1 via TRANSDERMAL

## 2015-05-08 MED ORDER — LACTATED RINGERS IV SOLN
INTRAVENOUS | Status: DC
  Administered 2015-05-08: 07:00:00 via INTRAVENOUS

## 2015-05-08 MED ORDER — OXYTOCIN 40 UNITS IN LACTATED RINGERS INFUSION - SIMPLE MED: 62.5000 mL/h | INTRAVENOUS | Status: AC

## 2015-05-08 MED ORDER — DIPHENHYDRAMINE HCL 50 MG/ML IJ SOLN: 12.5000 mg | INTRAMUSCULAR | Status: DC | PRN

## 2015-05-08 MED ORDER — OXYCODONE-ACETAMINOPHEN 5-325 MG PO TABS
2.0000 | ORAL_TABLET | ORAL | Status: DC | PRN
  Administered 2015-05-09 – 2015-05-11 (×4): 2 via ORAL
  Filled 2015-05-08 (×4): qty 2

## 2015-05-08 MED ORDER — NALBUPHINE HCL 10 MG/ML IJ SOLN: 5.0000 mg | Freq: Once | INTRAMUSCULAR | Status: DC | PRN

## 2015-05-08 MED ORDER — SODIUM CHLORIDE 0.9 % IR SOLN
Status: DC | PRN
  Administered 2015-05-08: 1000 mL

## 2015-05-08 MED ORDER — FENTANYL CITRATE (PF) 100 MCG/2ML IJ SOLN: 25.0000 ug | INTRAMUSCULAR | Status: DC | PRN

## 2015-05-08 MED ORDER — NALBUPHINE HCL 10 MG/ML IJ SOLN
INTRAMUSCULAR | Status: AC
  Filled 2015-05-08: qty 1

## 2015-05-08 MED ORDER — SIMETHICONE 80 MG PO CHEW
80.0000 mg | CHEWABLE_TABLET | Freq: Three times a day (TID) | ORAL | Status: DC
  Administered 2015-05-08 – 2015-05-11 (×9): 80 mg via ORAL
  Filled 2015-05-08 (×10): qty 1

## 2015-05-08 MED ORDER — LANOLIN HYDROUS EX OINT
1.0000 | TOPICAL_OINTMENT | CUTANEOUS | Status: DC | PRN
Start: 2015-05-08 — End: 2015-05-11

## 2015-05-08 MED ORDER — MORPHINE SULFATE (PF) 0.5 MG/ML IJ SOLN
INTRAMUSCULAR | Status: AC
  Filled 2015-05-08: qty 10

## 2015-05-08 MED ORDER — CEFAZOLIN SODIUM-DEXTROSE 2-3 GM-% IV SOLR
INTRAVENOUS | Status: DC | PRN
  Administered 2015-05-08: 2 g via INTRAVENOUS

## 2015-05-08 MED ORDER — IBUPROFEN 600 MG PO TABS: 600.0000 mg | ORAL_TABLET | Freq: Four times a day (QID) | ORAL | Status: DC | PRN

## 2015-05-08 MED ORDER — TETANUS-DIPHTH-ACELL PERTUSSIS 5-2.5-18.5 LF-MCG/0.5 IM SUSP: 0.5000 mL | Freq: Once | INTRAMUSCULAR | Status: DC

## 2015-05-08 MED ORDER — MEPERIDINE HCL 25 MG/ML IJ SOLN: 6.2500 mg | INTRAMUSCULAR | Status: DC | PRN

## 2015-05-08 MED ORDER — DIPHENHYDRAMINE HCL 25 MG PO CAPS: 25.0000 mg | ORAL_CAPSULE | Freq: Four times a day (QID) | ORAL | Status: DC | PRN

## 2015-05-08 MED ORDER — PRENATAL MULTIVITAMIN CH
1.0000 | ORAL_TABLET | Freq: Every day | ORAL | Status: DC
  Administered 2015-05-08 – 2015-05-10 (×3): 1 via ORAL
  Filled 2015-05-08 (×3): qty 1

## 2015-05-08 MED ORDER — NALOXONE HCL 2 MG/2ML IJ SOSY
1.0000 ug/kg/h | PREFILLED_SYRINGE | INTRAVENOUS | Status: DC | PRN
  Filled 2015-05-08: qty 2

## 2015-05-08 MED ORDER — MEPERIDINE HCL 25 MG/ML IJ SOLN
INTRAMUSCULAR | Status: AC
  Filled 2015-05-08: qty 1

## 2015-05-08 MED ORDER — NALOXONE HCL 0.4 MG/ML IJ SOLN: 0.4000 mg | INTRAMUSCULAR | Status: DC | PRN

## 2015-05-08 MED ORDER — NALBUPHINE HCL 10 MG/ML IJ SOLN
5.0000 mg | Freq: Once | INTRAMUSCULAR | Status: DC | PRN
  Filled 2015-05-08: qty 1

## 2015-05-08 MED ORDER — SENNOSIDES-DOCUSATE SODIUM 8.6-50 MG PO TABS
2.0000 | ORAL_TABLET | ORAL | Status: DC
  Administered 2015-05-08 – 2015-05-10 (×3): 2 via ORAL
  Filled 2015-05-08 (×3): qty 2

## 2015-05-08 MED ORDER — KETOROLAC TROMETHAMINE 30 MG/ML IJ SOLN: 30.0000 mg | Freq: Four times a day (QID) | INTRAMUSCULAR | Status: AC | PRN

## 2015-05-08 MED ORDER — DIPHENHYDRAMINE HCL 25 MG PO CAPS
25.0000 mg | ORAL_CAPSULE | ORAL | Status: DC | PRN
  Administered 2015-05-08: 25 mg via ORAL
  Filled 2015-05-08 (×2): qty 1

## 2015-05-08 MED ORDER — NALBUPHINE HCL 10 MG/ML IJ SOLN
5.0000 mg | INTRAMUSCULAR | Status: DC | PRN
  Administered 2015-05-08 (×2): 5 mg via INTRAVENOUS

## 2015-05-08 MED ORDER — ONDANSETRON HCL 4 MG/2ML IJ SOLN: 4.0000 mg | Freq: Three times a day (TID) | INTRAMUSCULAR | Status: DC | PRN

## 2015-05-08 MED ORDER — LACTATED RINGERS IV SOLN
INTRAVENOUS | Status: DC | PRN
  Administered 2015-05-08 (×2): via INTRAVENOUS

## 2015-05-08 MED ORDER — OXYCODONE-ACETAMINOPHEN 5-325 MG PO TABS: 1.0000 | ORAL_TABLET | ORAL | Status: DC | PRN

## 2015-05-08 MED ORDER — SIMETHICONE 80 MG PO CHEW: 80.0000 mg | CHEWABLE_TABLET | ORAL | Status: DC | PRN

## 2015-05-08 MED ORDER — ACETAMINOPHEN 325 MG PO TABS: 650.0000 mg | ORAL_TABLET | ORAL | Status: DC | PRN

## 2015-05-08 MED ORDER — CEFAZOLIN SODIUM-DEXTROSE 2-3 GM-% IV SOLR: 2.0000 g | INTRAVENOUS | Status: DC

## 2015-05-08 MED ORDER — NALBUPHINE HCL 10 MG/ML IJ SOLN: 5.0000 mg | INTRAMUSCULAR | Status: DC | PRN

## 2015-05-08 SURGICAL SUPPLY — 30 items
BARRIER ADHS 3X4 INTERCEED (GAUZE/BANDAGES/DRESSINGS) ×2 IMPLANT
CLAMP CORD UMBIL (MISCELLANEOUS) IMPLANT
CONTAINER PREFILL 10% NBF 15ML (MISCELLANEOUS) IMPLANT
DRAPE SHEET LG 3/4 BI-LAMINATE (DRAPES) IMPLANT
DRSG OPSITE POSTOP 4X10 (GAUZE/BANDAGES/DRESSINGS) ×2 IMPLANT
DURAPREP 26ML APPLICATOR (WOUND CARE) ×2 IMPLANT
ELECT REM PT RETURN 9FT ADLT (ELECTROSURGICAL) ×2
ELECTRODE REM PT RTRN 9FT ADLT (ELECTROSURGICAL) ×1 IMPLANT
EXTRACTOR VACUUM M CUP 4 TUBE (SUCTIONS) ×2 IMPLANT
GLOVE BIOGEL PI IND STRL 6.5 (GLOVE) ×2 IMPLANT
GLOVE BIOGEL PI IND STRL 7.0 (GLOVE) ×2 IMPLANT
GLOVE BIOGEL PI INDICATOR 6.5 (GLOVE) ×2
GLOVE BIOGEL PI INDICATOR 7.0 (GLOVE) ×2
GLOVE SURG SS PI 6.0 STRL IVOR (GLOVE) ×2 IMPLANT
GOWN STRL REUS W/TWL LRG LVL3 (GOWN DISPOSABLE) ×4 IMPLANT
KIT ABG SYR 3ML LUER SLIP (SYRINGE) IMPLANT
NEEDLE HYPO 25X5/8 SAFETYGLIDE (NEEDLE) IMPLANT
NS IRRIG 1000ML POUR BTL (IV SOLUTION) ×2 IMPLANT
PACK C SECTION WH (CUSTOM PROCEDURE TRAY) ×2 IMPLANT
PAD ABD 7.5X8 STRL (GAUZE/BANDAGES/DRESSINGS) ×2 IMPLANT
PAD OB MATERNITY 4.3X12.25 (PERSONAL CARE ITEMS) ×2 IMPLANT
PENCIL SMOKE EVAC W/HOLSTER (ELECTROSURGICAL) ×2 IMPLANT
RTRCTR C-SECT PINK 25CM LRG (MISCELLANEOUS) ×4 IMPLANT
SEPRAFILM MEMBRANE 5X6 (MISCELLANEOUS) IMPLANT
SPONGE GAUZE 4X4 12PLY STER LF (GAUZE/BANDAGES/DRESSINGS) ×4 IMPLANT
SUT PLAIN 0 NONE (SUTURE) IMPLANT
SUT VIC AB 0 CT1 36 (SUTURE) ×8 IMPLANT
SUT VIC AB 4-0 KS 27 (SUTURE) ×2 IMPLANT
TOWEL OR 17X24 6PK STRL BLUE (TOWEL DISPOSABLE) ×4 IMPLANT
TRAY FOLEY CATH SILVER 14FR (SET/KITS/TRAYS/PACK) ×2 IMPLANT

## 2015-05-08 NOTE — Addendum Note (Signed)
Addendum  created 05/08/15 0825 by Yolonda KidaAlison L Bernese Doffing, CRNA   Modules edited: Notes Section   Notes Section:  File: [id number]

## 2015-05-08 NOTE — Anesthesia Postprocedure Evaluation (Signed)
  Anesthesia Post-op Note  Patient: Tracy Hamilton  Procedure(s) Performed: Procedure(s): CESAREAN SECTION (N/A)  Patient Location: Mother/Baby  Anesthesia Type:Epidural  Level of Consciousness: awake, alert , oriented and patient cooperative  Airway and Oxygen Therapy: Patient Spontanous Breathing  Post-op Pain: none  Post-op Assessment: Post-op Vital signs reviewed, Patient's Cardiovascular Status Stable, Respiratory Function Stable, Patent Airway, No headache, No backache and Patient able to bend at knees              Post-op Vital Signs: Reviewed and stable  Last Vitals:  Filed Vitals:   05/08/15 0635  BP: 103/60  Pulse: 62  Temp: 36.7 C  Resp: 20    Complications: No apparent anesthesia complications

## 2015-05-08 NOTE — Progress Notes (Signed)
Pt prepped for repeat c/s, left 165 at 0018, and in OR at 0020.

## 2015-05-08 NOTE — Lactation Note (Signed)
This note was copied from the chart of Boy Uvaldo BristleShameka Curb. Lactation Consultation Note  Patient Name: Boy Uvaldo BristleShameka Nabers ZOXWR'UToday's Date: 05/08/2015 Reason for consult: Initial assessment  Visited with Mom, baby at 3812 hrs old.  This is 2nd baby, but 1st baby to breast feed.  Mom has large, heavy breasts with short nipple shafts.  Manual breast expression for easy flow of colostrum.  Multiple attempts to latch baby, but baby unable to attain a deep areolar latch.  Initiated a 24 mm nipple shield.  Several attempts and baby was able to maintain a regular suck/swallow rhythm.  Colostrum seen in shield.  Teaching done on importance of skin to skin, and cue based feedings.  Recommended a handpump to extend nipple, and use of breast shells.  LC to give Mom these. Brochure left in room.  Informed Mom of IP and OP lactation services.  To call for help as needed.  Encouraged Mom to do so.    Consult Status Consult Status: Follow-up Date: 05/09/15 Follow-up type: In-patient    Judee ClaraSmith, Leonora Gores E 05/08/2015, 1:23 PM

## 2015-05-08 NOTE — Transfer of Care (Signed)
Immediate Anesthesia Transfer of Care Note  Patient: Tracy Hamilton  Procedure(s) Performed: Procedure(s): CESAREAN SECTION (N/A)  Patient Location: PACU  Anesthesia Type:Epidural  Level of Consciousness: awake, alert  and oriented  Airway & Oxygen Therapy: Patient Spontanous Breathing  Post-op Assessment: Report given to RN and Post -op Vital signs reviewed and stable  Post vital signs: Reviewed and stable  Last Vitals:  Filed Vitals:   05/08/15 0016  BP: 105/64  Pulse: 104  Temp:   Resp:     Complications: No apparent anesthesia complications

## 2015-05-08 NOTE — Lactation Note (Signed)
This note was copied from the chart of Tracy Uvaldo BristleShameka Maturin. Lactation Consultation Note  Devin RN requested assistance w/ mother's personal DEBP. Helpled mother hand express good flow of colostrum.  Mother needs to use both hands and and apply firm pressure. Assisted mother w/ using her DEBP.  Discussed milk storage and reminded parents about cleaning. Baby is fully dressed and sleeping and at this time is having low temps so no STS until temperature resoloved. Suggest mother call for help w/ next feeding.  Patient Name: Tracy Hamilton: 05/08/2015 Reason for consult: Follow-up assessment   Maternal Data    Feeding    LATCH Score/Interventions                Intervention(s): Breastfeeding basics reviewed     Lactation Tools Discussed/Used Tools: Shells;Pump Shell Type: Inverted Breast pump type: Other (comment) (also LC set up moms DEBP Medela with instructions , see LC note ) Pump Review: Setup, frequency, and cleaning Initiated by:: MAI  Hamilton initiated:: 05/08/15   Consult Status Consult Status: Follow-up Hamilton: 05/09/15 Follow-up type: In-patient    Dahlia ByesBerkelhammer, [facility] 05/08/2015, 5:41 PM

## 2015-05-08 NOTE — Anesthesia Postprocedure Evaluation (Signed)
  Anesthesia Post-op Note  Patient: Tracy Hamilton  Procedure(s) Performed: Procedure(s): CESAREAN SECTION (N/A)  Patient Location: PACU  Anesthesia Type:Epidural  Level of Consciousness: awake, alert  and oriented  Airway and Oxygen Therapy: Patient Spontanous Breathing  Post-op Pain: none  Post-op Assessment: Post-op Vital signs reviewed, Patient's Cardiovascular Status Stable, Respiratory Function Stable, Patent Airway, No signs of Nausea or vomiting, Pain level controlled, No headache, No backache and Spinal receding              Post-op Vital Signs: Reviewed and stable  Last Vitals:  Filed Vitals:   05/08/15 0235  BP: 119/63  Pulse: 69  Temp: 36.9 C  Resp: 18    Complications: No apparent anesthesia complications

## 2015-05-08 NOTE — Progress Notes (Signed)
Patient ID: Tracy BristleShameka Grotz, female   DOB: 04/13/1986, 29 y.o.   MRN: [id number] Patient comfortable with epidural but with little cervical change since 4 am. Pitocin had to be turned off and on several times during the day secondary to fetal intolerance of labor. Patient is now requesting repeat cesarean section. Risks, benefits and alternatives were explained including but not limited to risks of bleeding, infection and damage to adjacent organs. Patient verbalized understanding and all questions were explained. Consent signed. Fetal status is now reassuring with a baseline 130, mod variability, +accels. COntractions are now every 4-5 minutes now that pitocin has been turned off.

## 2015-05-08 NOTE — Consult Note (Signed)
[facility]  Delivery Note:  C-section       05/08/2015  12:41 AM  I was called to the operating room at the request of the patient's obstetrician (Dr. Adrian BlackwaterStinson) for a primary c-section for non reassuirng fetal heart tones.  PRENATAL HX:  29 y/o G3P1011 at 1339 and 5/6 weeks, admitted yesterday in active labor, augmented with pitocin, ROm x20.5 hours, developed non-reasuring fetal heart tones at 5 cm dilation so delivery by c-section.  Thick mec noted prior to delivery.  GBS positive with adequate treatment.    DELIVERY:  Infant was vigorous at delivery, requiring no resuscitation other than standard warming, drying and stimulation.  APGARs 8 and 9.  Exam notable for molding, R testicle palpated high in scrotum, left testicle not palpated, otherwise exam within normal limits.  After 5 minutes, baby left with nurse to assist parents with skin-to-skin care.   _____________________ Electronically Signed By: Maryan CharLindsey Lakeria Starkman, MD Neonatologist

## 2015-05-08 NOTE — Lactation Note (Signed)
This note was copied from the chart of Tracy Uvaldo BristleShameka Teston. Lactation Consultation Note  Mother pumped approx 2 ml of colostrum. Had mother prepump w/ hand pump and reviewed how to apply #24NS and how to prefill w/ any pumped breastmilk. Attempted latching baby but he is still sleepy. Allowed him to suck gloved finger and syringe fed him 2 ml of colostrum. Encouraged mother to feed upon demand and pump again in approx 3 hours.   Suggest she call for help w/ latching.  Patient Name: Tracy Hamilton ZOXWR'UToday's Date: 05/08/2015 Reason for consult: Follow-up assessment   Maternal Data    Feeding Feeding Type: Breast Fed  LATCH Score/Interventions                      Lactation Tools Discussed/Used Tools: Shells;Pump Nipple shield size: 24   Consult Status Consult Status: Follow-up Date: 05/09/15 Follow-up type: In-patient    Dahlia ByesBerkelhammer, Tobyn [facility] 05/08/2015, 6:35 PM

## 2015-05-08 NOTE — Op Note (Signed)
Tracy Hamilton Kopper PROCEDURE DATE: 05/06/2015 - 05/08/2015  PREOPERATIVE DIAGNOSIS: Intrauterine pregnancy at  8769w5d weeks gestation; non-reassuring fetal status  POSTOPERATIVE DIAGNOSIS: The same  PROCEDURE:     Cesarean Section  SURGEON:  Dr. Gigi GinPeggy Nathan Stallworth  ASSISTANT: none  INDICATIONS: Tracy Hamilton Hou is a 29 y.o. G3P1011 at 10569w5d scheduled for cesarean section secondary to non-reassuring fetal status.  The risks of cesarean section discussed with the patient included but were not limited to: bleeding which may require transfusion or reoperation; infection which may require antibiotics; injury to bowel, bladder, ureters or other surrounding organs; injury to the fetus; need for additional procedures including hysterectomy in the event of a life-threatening hemorrhage; placental abnormalities wth subsequent pregnancies, incisional problems, thromboembolic phenomenon and other postoperative/anesthesia complications. The patient concurred with the proposed plan, giving informed written consent for the procedure.    FINDINGS:  Viable female infant in cephalic presentation.  Apgars 8 and 9.  Clear amniotic fluid.  Intact placenta, three vessel cord.  Normal uterus, fallopian tubes and ovaries bilaterally.  ANESTHESIA:    Spinal INTRAVENOUS FLUIDS:2500 ml ESTIMATED BLOOD LOSS: 600 ml URINE OUTPUT:  200 ml SPECIMENS: Placenta sent to L&D COMPLICATIONS: None immediate  PROCEDURE IN DETAIL:  The patient received intravenous antibiotics and had sequential compression devices applied to her lower extremities while in the preoperative area.  She was then taken to the operating room where anesthesia was induced and was found to be adequate. A foley catheter was placed into her bladder and attached to Niley Helbig gravity. She was then placed in a dorsal supine position with a leftward tilt, and prepped and draped in a sterile manner. After an adequate timeout was performed, a Pfannenstiel skin incision was  made with scalpel and carried through to the underlying layer of fascia. The fascia was incised in the midline and this incision was extended bilaterally using the Mayo scissors. Kocher clamps were applied to the superior aspect of the fascial incision and the underlying rectus muscles were dissected off bluntly. A similar process was carried out on the inferior aspect of the facial incision. The rectus muscles were separated in the midline bluntly and the peritoneum was entered bluntly. The Alexis self-retaining retractor was introduced into the abdominal cavity. Attention was turned to the lower uterine segment where a bladder flap was created, and a transverse hysterotomy was made with a scalpel and extended bilaterally bluntly. The infant was successfully delivered, and cord was clamped and cut and infant was handed over to awaiting neonatology team. Uterine massage was then administered and the placenta delivered intact with three-vessel cord. The uterus was cleared of clot and debris.  The hysterotomy was closed with 0 Vicryl in a running locked fashion, and an imbricating layer was also placed with a 0 Vicryl. Overall, excellent hemostasis was noted. The pelvis copiously irrigated and cleared of all clot and debris. Hemostasis was confirmed on all surfaces.  Interceed was applied over the hysterotomy.The peritoneum and the muscles were reapproximated using 0 vicryl interrupted stitches. The fascia was then closed using 0 Vicryl in a running fashion.  The skin was closed in a subcuticular fashion using 3.0 Vicryl. The patient tolerated the procedure well. Sponge, lap, instrument and needle counts were correct x 2. She was taken to the recovery room in stable condition.    Marilena Trevathan,PEGGYMD  05/08/2015 1:12 AM

## 2015-05-08 NOTE — Lactation Note (Signed)
This note was copied from the chart of Tracy Hamilton Ekstrom. Lactation Consultation Note  Patient Name: Tracy Hamilton [name]'XToday's Date: 05/08/2015 Reason for consult: Follow-up assessment;Other (Comment) (instrtuction of hand pump and shells )  F/U per previous LC recommendation to set up a DEBp , shells , hand pump.  Since mom has her DEBP from home - LC recommended pre- pump with hand pump and post pump both breast for 10 mins  With her pump for extra stimulation. Also instructed mom on cleaning pump pieces ( soap and basin provided )     Maternal Data Formula Feeding for Exclusion: No Has patient been taught Hand Expression?: Yes Does the patient have breastfeeding experience prior to this delivery?: No  Feeding Feeding Type: Breast Fed  LATCH Score/Interventions Latch: Repeated attempts needed to sustain latch, nipple held in mouth throughout feeding, stimulation needed to elicit sucking reflex. Intervention(s): Skin to skin;Teach feeding cues;Waking techniques Intervention(s): Breast compression;Breast massage;Assist with latch;Adjust position  Audible Swallowing: A few with stimulation Intervention(s): Skin to skin;Hand expression Intervention(s): Skin to skin;Hand expression;Alternate breast massage  Type of Nipple: Everted at rest and after stimulation (short nipple shaft)  Comfort (Breast/Nipple): Soft / non-tender     Hold (Positioning): Assistance needed to correctly position infant at breast and maintain latch. Intervention(s): Breastfeeding basics reviewed  LATCH Score: 7  Lactation Tools Discussed/Used Tools: Shells;Pump Nipple shield size: 24 Shell Type: Inverted Breast pump type: Other (comment) (also LC set up moms DEBP Medela with instructions , see LC note ) Pump Review: Setup, frequency, and cleaning Initiated by:: MAI  Date initiated:: 05/08/15   Consult Status Consult Status: Follow-up Date: 05/09/15 Follow-up type: In-patient    [name],  [name] 05/08/2015, 3:50 PM

## 2015-05-09 ENCOUNTER — Encounter (HOSPITAL_COMMUNITY): Payer: Self-pay | Admitting: *Deleted

## 2015-05-09 LAB — CBC
HEMATOCRIT: 32.2 % — AB (ref 36.0–46.0)
Hemoglobin: 10.7 g/dL — ABNORMAL LOW (ref 12.0–15.0)
MCH: 30.1 pg (ref 26.0–34.0)
MCHC: 33.2 g/dL (ref 30.0–36.0)
MCV: 90.7 fL (ref 78.0–100.0)
Platelets: 240 10*3/uL (ref 150–400)
RBC: 3.55 MIL/uL — AB (ref 3.87–5.11)
RDW: 14.5 % (ref 11.5–15.5)
WBC: 10.4 10*3/uL (ref 4.0–10.5)

## 2015-05-09 NOTE — Lactation Note (Signed)
This note was copied from the chart of Tracy Tracy Hamilton. Lactation Consultation Note  Patient Name: Tracy Hamilton ZOXWR'UToday's Date: 05/09/2015 Reason for consult: Follow-up assessment;Difficult latch   Follow up with mom due to difficult latch. Mom says she still want to try to BF but has decided to give bottles and pump for now. Infant now 3938 hours old. He currently weighs 6 lb 9.5 oz with 4% weight loss since birth. He has had 2 BF for 15-30 minutes, 2 attempts, BM supplementation via syringe/bottle x 3 of 2-4 cc, formula supplementation via bottle x 2 of 6-15 cc, 3 voids and 4 stools in last 24 hours. Infant with latch scores of 6-8. Infant has a pacifier attached to his sleeper. Enc mom to feed 8-12 x in 24 hours, BF first followed by formula/EBM supplementation and pumping q 2-3 hours for 15 minutes. Mom has Personal Medela PIS at bedside that she is using. Enc mom to call for assistance as needed.    Maternal Data Formula Feeding for Exclusion: No Does the patient have breastfeeding experience prior to this delivery?: No  Feeding    LATCH Score/Interventions                      Lactation Tools Discussed/Used Pump Review: Setup, frequency, and cleaning   Consult Status Consult Status: Follow-up Date: 05/10/15 Follow-up type: In-patient    Tracy Hamilton 05/09/2015, 3:27 PM

## 2015-05-09 NOTE — Progress Notes (Signed)
Post Partum Day 1  Subjective:  Uvaldo BristleShameka Leaver is a 29 y.o. G3P2011 8525w5d s/p RLTCS after failed TOLAC  No acute events overnight.  Pt denies problems with ambulating, voiding or po intake.  She denies nausea or vomiting.  Pain is moderately controlled.  She has had flatus. She has not had bowel movement.  Lochia Minimal.  Plan for birth control is oral contraceptives (estrogen/progesterone).  Method of Feeding: Breast feeding   Objective: BP 123/70 mmHg  Pulse 68  Temp(Src) 98 F (36.7 C) (Oral)  Resp 18  Ht 5\' 6"  (1.676 m)  Wt 109.317 kg (241 lb)  BMI 38.92 kg/m2  SpO2 100%  LMP 08/09/2014  Breastfeeding? Unknown  Physical Exam:  General: alert, cooperative and no distress Lochia:normal flow Chest: CTAB Heart: RRR no m/r/g Abdomen: +BS, soft, nontender, fundus firm at/below umbilicus Uterine Fundus: firm,  DVT Evaluation: No evidence of DVT seen on physical exam. Extremities: No lower extremity edema   Recent Labs  05/06/15 1830 05/09/15 0521  HGB 12.9 10.7*  HCT 38.7 32.2*    Assessment/Plan:  ASSESSMENT: Uvaldo BristleShameka Joines is a 29 y.o. G3P2011 7825w5d ppd #1 s/p RLTCS after failed TOLAC. GBS+, adequately treated,  doing well.   Continue Postpartum care.    LOS: 3 days   Asiyah Z Mikell 05/09/2015, 10:37 AM   CNM attestation Post Partum Day #1 I have seen and examined this patient and agree with above documentation in the resident's note.   Uvaldo BristleShameka Roderick is a 29 y.o. [id number] s/p rLTCS.  Pt denies problems with ambulating, voiding or po intake. Pain is well controlled.  Plan for birth control is no method.  Method of Feeding: breast  PE:  BP 128/74 mmHg  Pulse 68  Temp(Src) 98.2 F (36.8 C) (Oral)  Resp 18  Ht 5\' 6"  (1.676 m)  Wt 109.317 kg (241 lb)  BMI 38.92 kg/m2  SpO2 100%  LMP 08/09/2014  Breastfeeding? Unknown Fundus firm  Plan for discharge: 05/11/15  Cam HaiSHAW, KIMBERLY, CNM 12:58 AM

## 2015-05-09 NOTE — Lactation Note (Signed)
This note was copied from the chart of Tracy Hamilton Ekstrand. Lactation Consultation Note Mom having trouble latching on w/#24 NS. Mom has flat large nipples that compress flat. Thick areola tissue. Has long pendulum breast. Encouraged to elevate/support breast w/cloth under breast. Got mom to demonstrated application of NS. Baby fussy, screaming, hungry. Hand expression 4 ml colostrum. Inserted into NS and taught to latch w/"C" hold, finally got baby to suckle on breast. Encouraged mom to do STS and hand expression. Baby BF well. Answered questions. Reviewed I&O, looking for residual in NS. Encouraged to wear shells in bra, has them but not wearing the. Mom has personal DEBP and giving supplementation w/syring. Reported to nurse. Patient Name: Tracy Hamilton Heiden ZOXWR'UToday's Date: 05/09/2015 Reason for consult: Follow-up assessment   Maternal Data    Feeding Feeding Type: Breast Milk Length of feed: 15 min  LATCH Score/Interventions Latch: Repeated attempts needed to sustain latch, nipple held in mouth throughout feeding, stimulation needed to elicit sucking reflex. Intervention(s): Skin to skin;Teach feeding cues;Waking techniques Intervention(s): Adjust position;Assist with latch;Breast massage;Breast compression  Audible Swallowing: A few with stimulation Intervention(s): Skin to skin;Hand expression Intervention(s): Hand expression;Alternate breast massage  Type of Nipple: Flat Intervention(s): Shells;Double electric pump  Comfort (Breast/Nipple): Soft / non-tender     Hold (Positioning): Assistance needed to correctly position infant at breast and maintain latch. Intervention(s): Breastfeeding basics reviewed;Support Pillows;Position options;Skin to skin  LATCH Score: 6  Lactation Tools Discussed/Used Tools: Shells;Nipple Dorris CarnesShields;Pump Nipple shield size: 24 Shell Type: Inverted Breast pump type: Double-Electric Breast Pump   Consult Status Consult Status: Follow-up Date:  05/10/15 Follow-up type: In-patient    Charyl DancerCARVER, Jeneal Vogl G 05/09/2015, 8:41 AM

## 2015-05-10 NOTE — Progress Notes (Signed)
Post Partum Day 2  Subjective:  Tracy Hamilton is a 29 y.o. [id number] 5987w5d s/p RLTCS after TOLAC   No acute events overnight.  Pt denies problems with ambulating, voiding or po intake.  She denies nausea or vomiting.  Pain is well controlled.  She has had flatus. She has not had bowel movement.  Lochia Minimal.  Plan for birth control is oral contraceptives (estrogen/progesterone), oral progesterone-only contraceptive.  Method of Feeding: Breast feeding   Objective: BP 112/64 mmHg  Pulse 66  Temp(Src) 98.2 F (36.8 C) (Oral)  Resp 18  Ht 5\' 6"  (1.676 m)  Wt 241 lb (109.317 kg)  BMI 38.92 kg/m2  SpO2 100%  LMP 08/09/2014  Breastfeeding? Unknown  Physical Exam:  General: alert, cooperative and no distress Lochia:normal flow Chest: CTAB Heart: RRR no m/r/g Abdomen: +BS, soft, nontender, fundus firm at/below umbilicus Uterine Fundus: firm,  DVT Evaluation: No evidence of DVT seen on physical exam. Extremities: no lower extremity edema   Recent Labs  05/09/15 0521  HGB 10.7*  HCT 32.2*    Assessment/Plan:  ASSESSMENT: Tracy Hamilton is a 29 y.o. [id number] 2287w5d ppd #2  S/p RLTCS after TOLAC  doing well.    Plan for discharge tomorrow   LOS: 4 days   Tracy Hamilton 05/10/2015, 7:54 AM   OB fellow attestation Post Partum Day 1 I have seen and examined this patient and agree with above documentation in the resident's note.   Tracy Hamilton is a 29 y.o. [id number] s/p rLTCS.  Pt denies problems with ambulating, voiding or po intake. Pain is well controlled.  Plan for birth control is oral contraceptives (estrogen/progesterone).  Method of Feeding: Breast/Bottle  PE:  BP 112/64 mmHg  Pulse 66  Temp(Src) 98.2 F (36.8 C) (Oral)  Resp 18  Ht 5\' 6"  (1.676 m)  Wt 241 lb (109.317 kg)  BMI 38.92 kg/m2  SpO2 100%  LMP 08/09/2014  Breastfeeding? Unknown Gen: well appearing Heart: reg rate Lungs: normal WOB Fundus firm Ext: soft, no pain, no edema  Plan for  discharge: POD#2, plan for d/c tomorrow. Continue current care  Federico FlakeKimberly Niles Tracy Cropley, MD 10:05 AM

## 2015-05-11 MED ORDER — OXYCODONE-ACETAMINOPHEN 5-325 MG PO TABS: 1.0000 | ORAL_TABLET | ORAL | Status: DC | PRN

## 2015-05-11 MED ORDER — IBUPROFEN 600 MG PO TABS: 600.0000 mg | ORAL_TABLET | Freq: Four times a day (QID) | ORAL | Status: AC | PRN

## 2015-05-11 NOTE — Discharge Instructions (Signed)

## 2015-05-11 NOTE — Lactation Note (Signed)
This note was copied from the chart of Tracy Uvaldo BristleShameka Brazil. Lactation Consultation Note  Mom plans to pump and bottle feed for now.  She has an appointment with [facility] on Monday.  Encouraged pumping every 3 hours for 15-20 minutes.  Patient Name: Tracy Hamilton     Maternal Data    Feeding Feeding Type: Formula Nipple Type: Slow - flow  LATCH Score/Interventions                      Lactation Tools Discussed/Used     Consult Status      Soyla DryerJoseph, Daya Dutt Hamilton, 10:46 AM

## 2015-05-11 NOTE — Discharge Summary (Signed)
OB Discharge Summary     Patient Name: Tracy Hamilton DOB: 05/13/1986 MRN: [id number]  Date of admission: 05/06/2015 Delivering MD: Catalina AntiguaONSTANT, PEGGY   Date of discharge: 05/11/2015  Admitting diagnosis: 39w ctx are close, blood clotting Intrauterine pregnancy: 3963w5d     Secondary diagnosis:  Active Problems:   Vaginal bleeding in pregnancy   Active labor at term   Placental abruption   Labor and delivery, indication for care  Additional problems: Prev C/S     Discharge diagnosis: Term Pregnancy Delivered; Failed IOL/VBAC                                                                                                Post partum procedures:none  Augmentation: Pitocin and Foley Balloon  Complications: Placental Abruption  Hospital course:  Induction of Labor With Cesarean Section  29 y.o. yo [id number] at 5163w5d was admitted to the hospital 05/06/2015 for induction of labor as a TOLAC due to vag bldg and suspected placental abruption. Patient had a labor course significant for having a foley placed followed by Pitocin, which was stopped on occasion for fetal intolerance of labor. The patient went for cesarean section due to Non-Reassuring FHR, and delivered a Viable infant,@BABYSUPPRESS (DBLINK,ept,110,,1,,) Membrane Rupture Time/Date: )3:53 AM ,05/07/2015   @Details  of operation can be found in separate operative Note.  Patient had an uncomplicated postpartum course. She is ambulating, tolerating a regular diet, passing flatus, and urinating well.  Patient is discharged home in stable condition on 05/11/15.                                     Physical exam  Filed Vitals:   05/09/15 1824 05/10/15 0600 05/10/15 1843 05/11/15 0657  BP: 110/68 112/64 128/74 136/79  Pulse: 71 66 68 60  Temp: 98.1 F (36.7 C) 98.2 F (36.8 C) 98.2 F (36.8 C) 98.6 F (37 C)  TempSrc: Oral  Oral Oral  Resp: 18 18 18 20   Height:      Weight:      SpO2:       General: alert and  cooperative Lochia: appropriate Uterine Fundus: firm Incision: Dressing is clean, dry, and intact DVT Evaluation: No evidence of DVT seen on physical exam. Labs: Lab Results  Component Value Date   WBC 10.4 05/09/2015   HGB 10.7* 05/09/2015   HCT 32.2* 05/09/2015   MCV 90.7 05/09/2015   PLT 240 05/09/2015   No flowsheet data found.  Discharge instruction: per After Visit Summary and "Baby and Me Booklet".  After visit meds:    Medication List    TAKE these medications        ibuprofen 600 MG tablet  Commonly known as:  ADVIL,MOTRIN  Take 1 tablet (600 mg total) by mouth every 6 (six) hours as needed for mild pain.     oxyCODONE-acetaminophen 5-325 MG tablet  Commonly known as:  PERCOCET/ROXICET  Take 1 tablet by mouth every 4 (four) hours as needed (for pain scale 4-7).     PRENATAL  VITAMIN PO  Take 1 tablet by mouth daily.        Diet: routine diet  Activity: Advance as tolerated. Pelvic rest for 6 weeks.   Outpatient follow up:6 weeks Follow up Appt:No future appointments. Follow up Visit:No Follow-up on file.  Postpartum contraception: OCPs, but unsure at this point if she will be mostly breastfeeding or bottlefeeding  Newborn Data: Live born female  Birth Weight: 6 lb 14.4 oz (3130 g) APGAR: 8, 9  Baby Feeding: Bottle and Breast Disposition:home with mother   05/11/2015 Cam Hai, CNM  05/11/2015

## 2015-06-01 IMAGING — CR DG HIP (WITH OR WITHOUT PELVIS) 2-3V*L*
3 series · 3 of 3 positions shown · non-contrast
Comparison: None.

CLINICAL DATA: 27-year-old female with pain radiating to the left
groin and lower extremity. Initial encounter.

EXAM:
LEFT HIP - COMPLETE 2+ VIEW

[t pelvis a.p.]
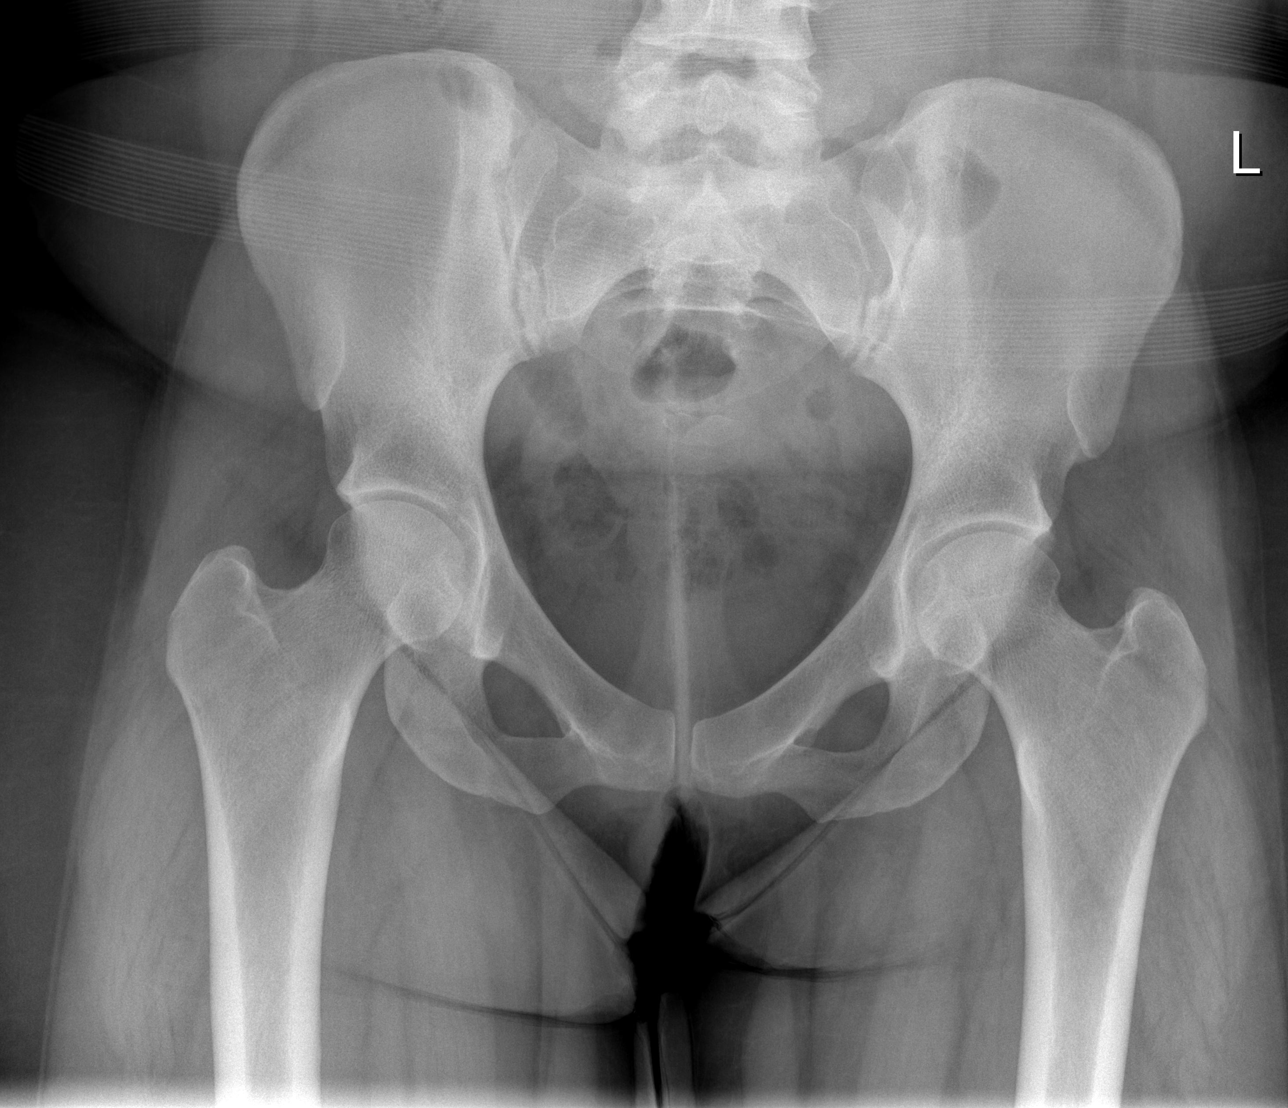

[t hip ap left]
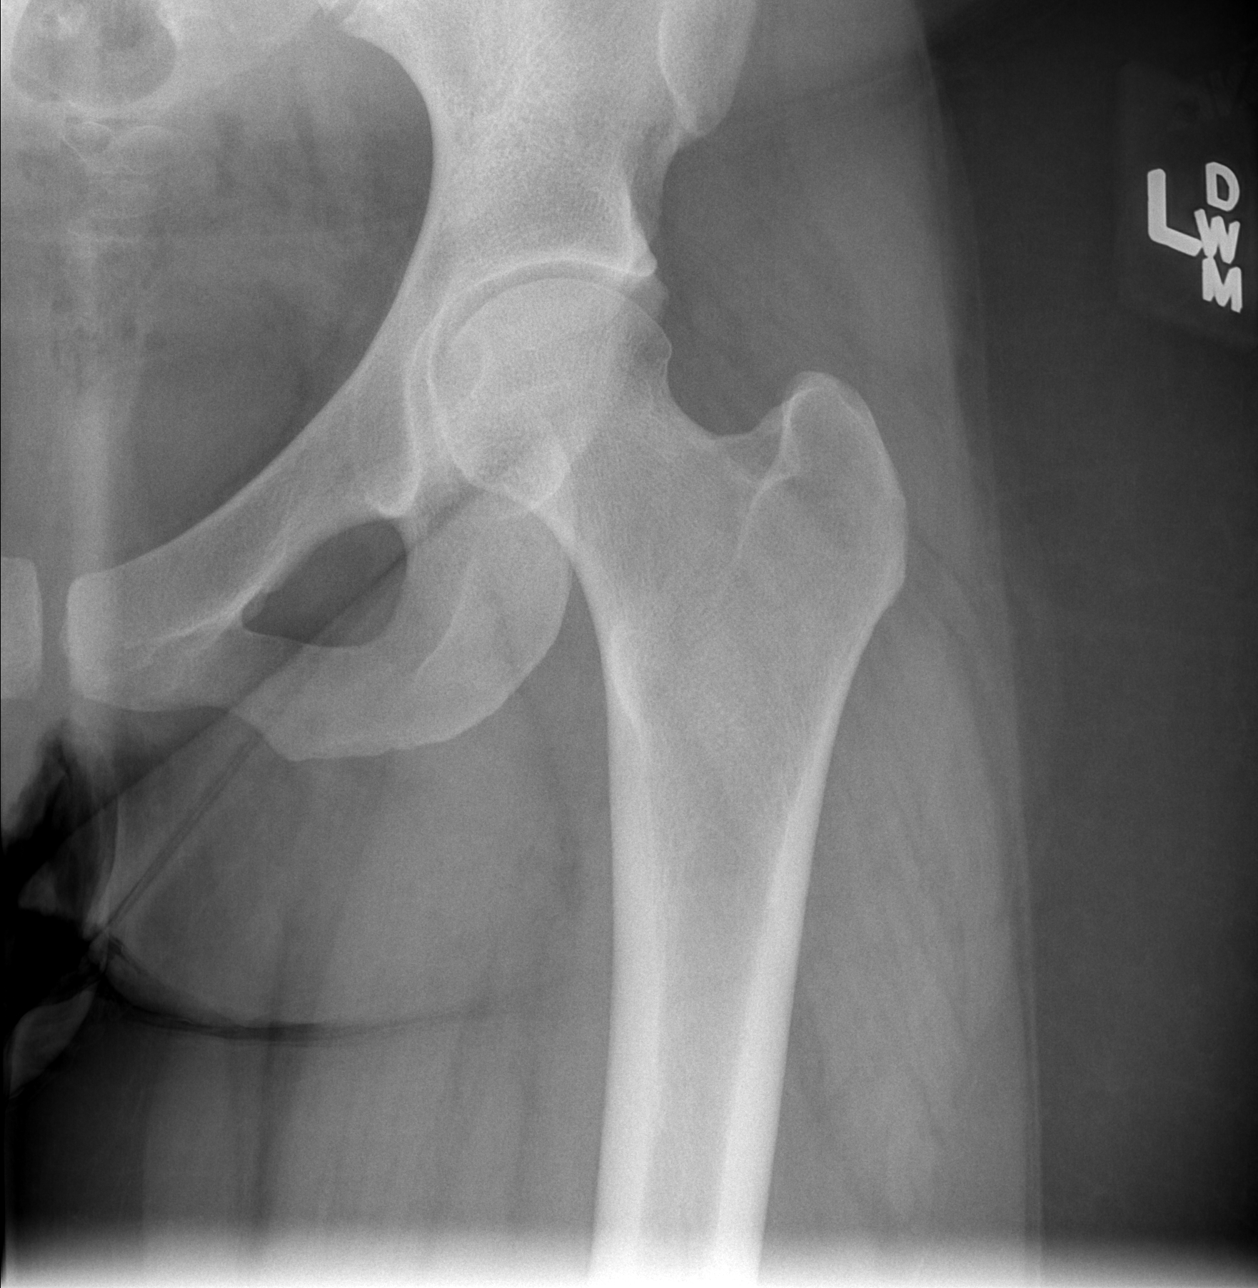

[t hip frog leg left]
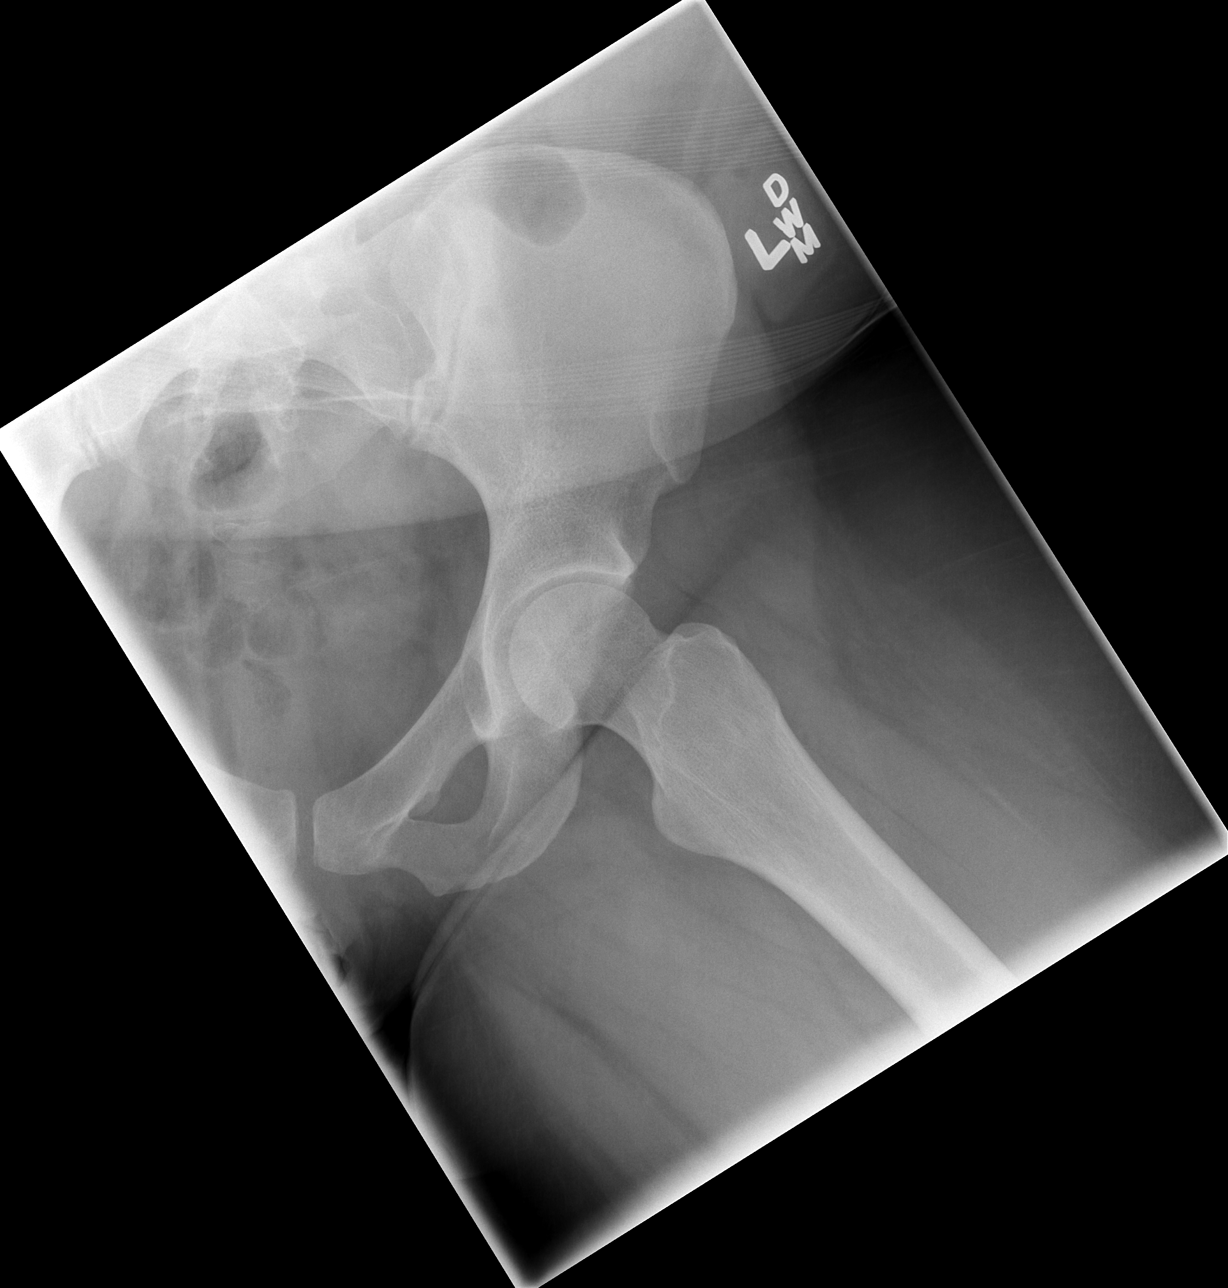

[3 of 3 positions shown; findings below may reference images not displayed]

FINDINGS: Bone mineralization is within normal limits. Femoral heads normally
located. Hip joint spaces preserved. Pelvis intact. SI joints within
normal limits. Proximal left femur intact.
IMPRESSION: Negative radiographic appearance of the left hip and pelvis.

## 2016-04-20 IMAGING — US US MFM FETAL NUCHAL TRANSLUCENCY
1 series · 13 of 28 positions shown · non-contrast
Comparison: none

[Series 1: us mfm fetal nuchal translucency · 0.18mm/px · 13 of 49 slices shown]
[im 2/49]
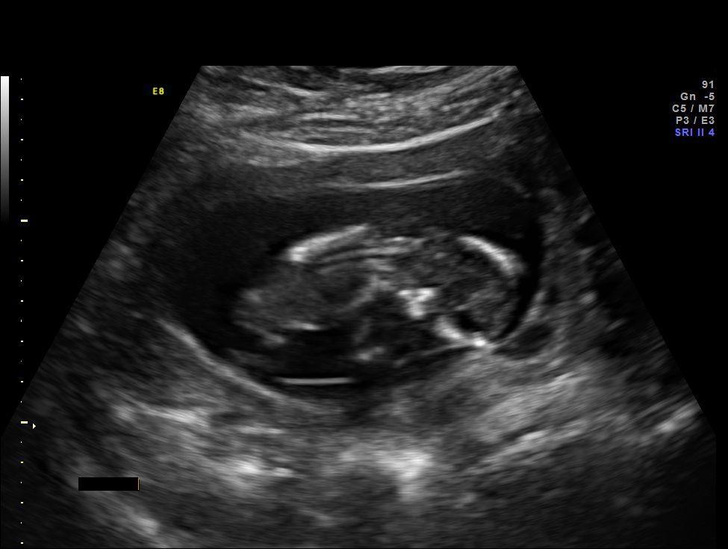
[im 6/49]
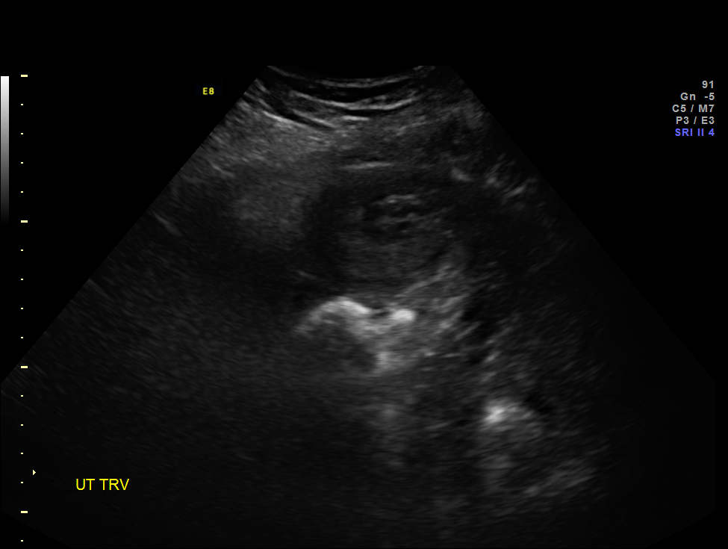
[im 9/49]
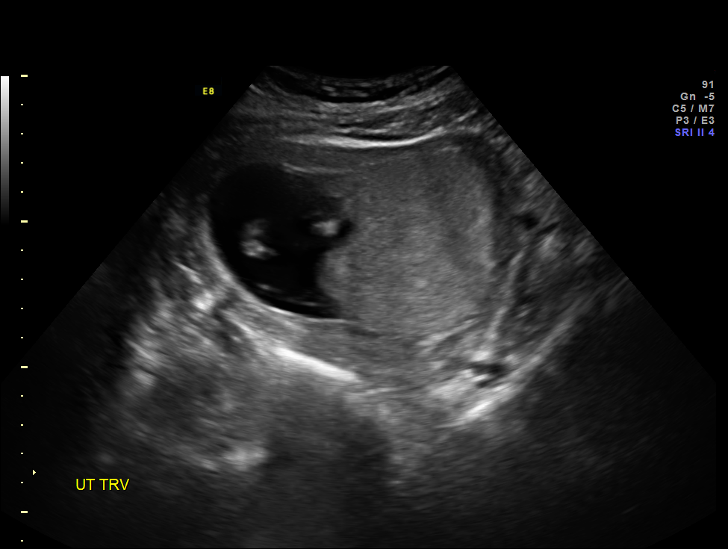
[im 13/49]
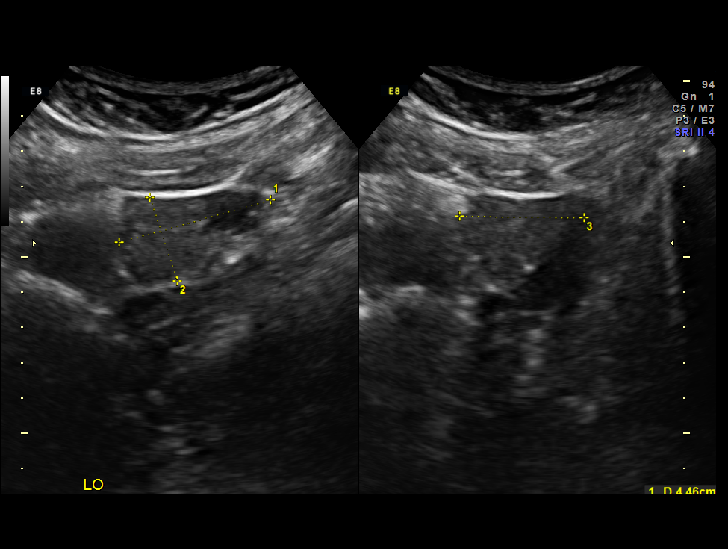
[im 17/49]
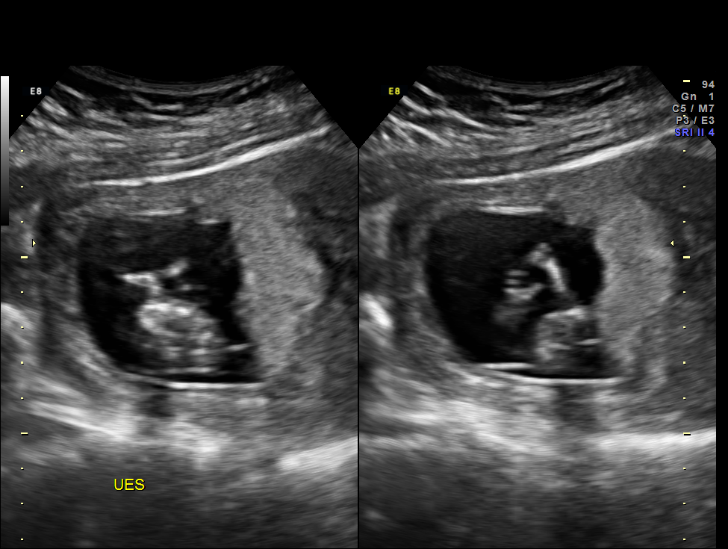
[im 20/49]
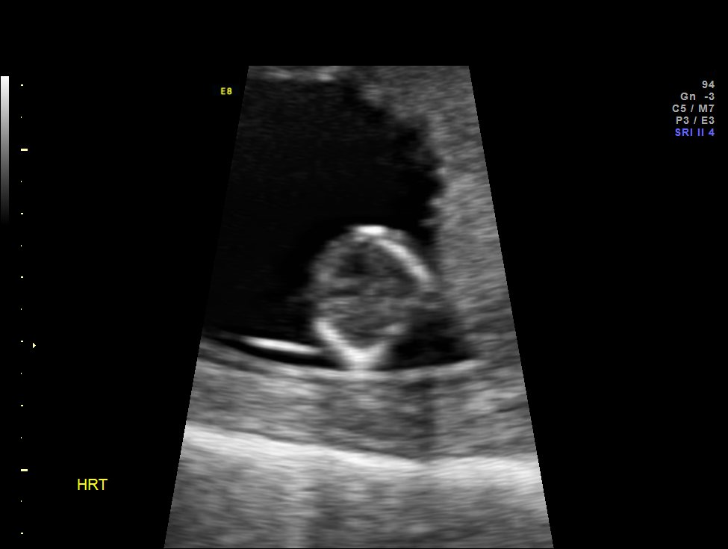
[im 25/49]
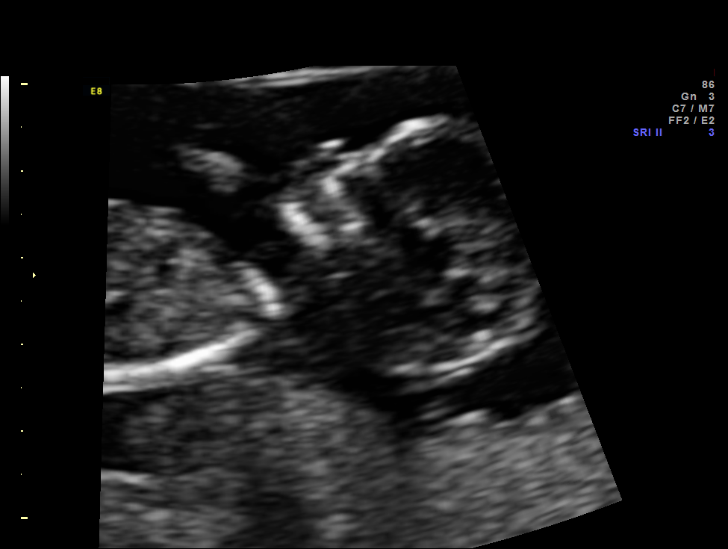
[im 29/49]
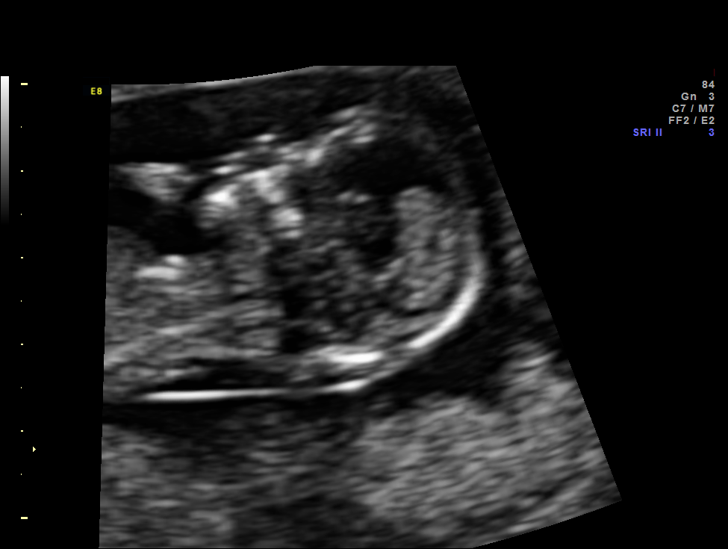
[im 33/49]
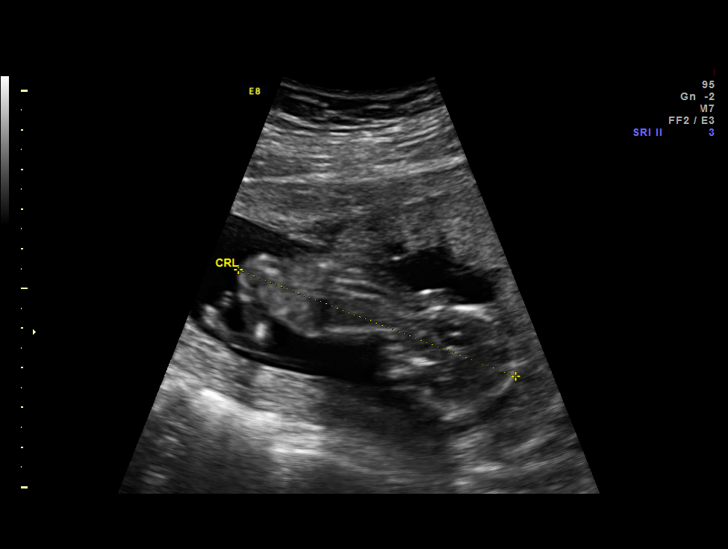
[im 36/49]
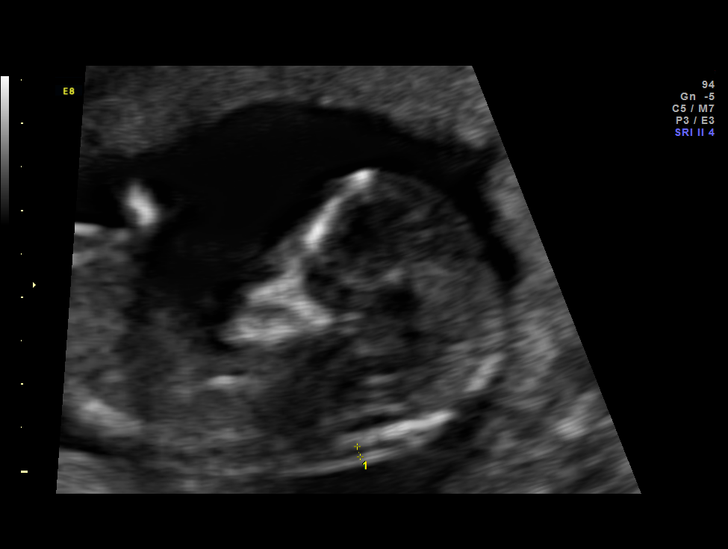
[im 40/49]
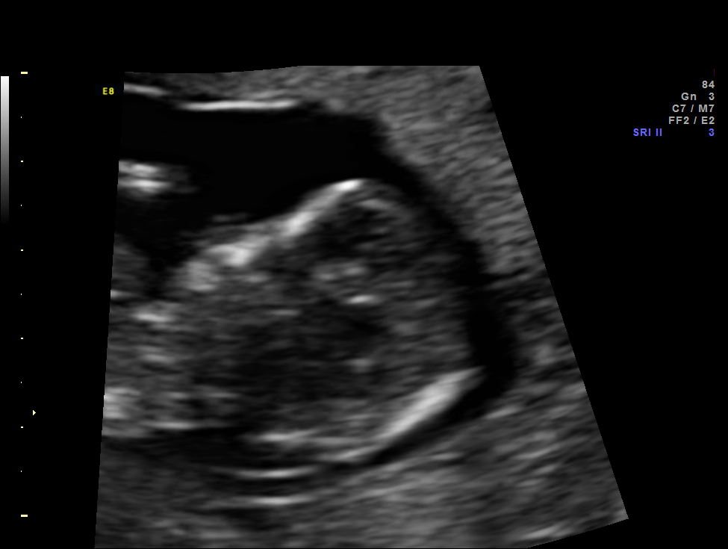
[im 43/49]
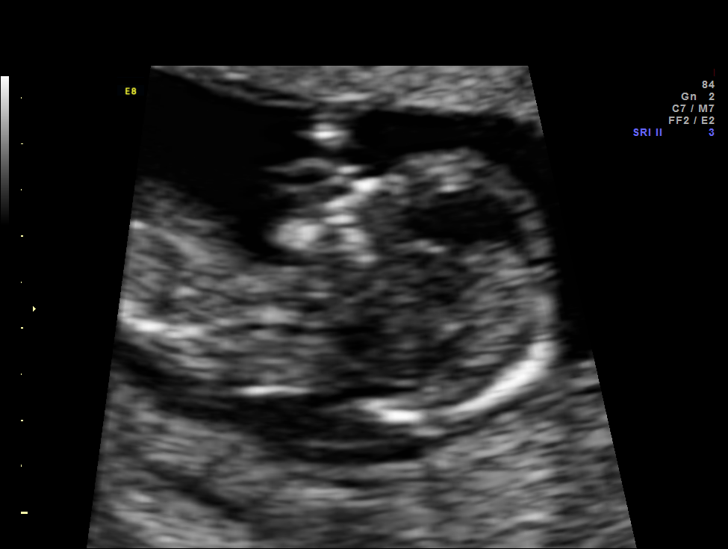
[im 47/49]
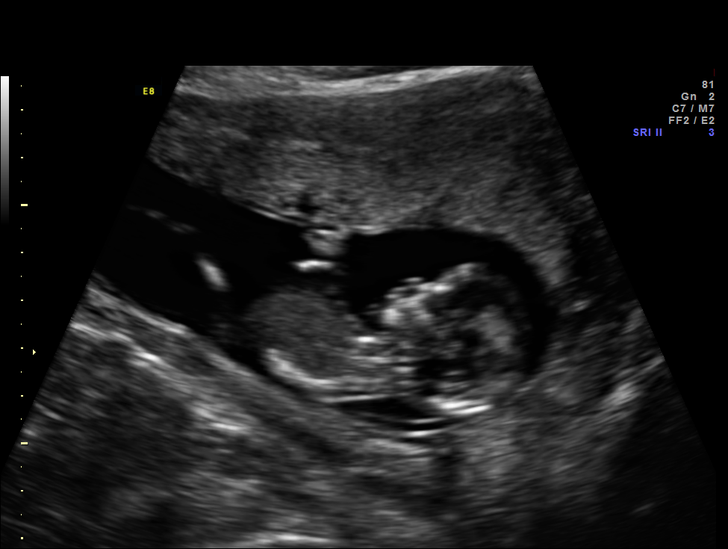

[13 of 28 positions shown; findings below may reference images not displayed]

OBSTETRICS REPORT
(Signed Final 11/05/2014 [DATE])

Faculty Physician
Service(s) Provided

Indications

First trimester aneuploidy screen (NT)                Z36
13 weeks gestation of pregnancy
Previous cesarean section
Fetal Evaluation

Num Of Fetuses:    1
Preg. Location:    Intrauterine
Gest. Sac:         Intrauterine
Yolk Sac:          Not visualized
Fetal Pole:        Visualized
Fetal Heart Rate:  169                          bpm
Cardiac Activity:  Observed
Biometry

CRL:     76.6  mm     G. Age:  13w 3d                 EDD:    05/10/15

NT:       1.4  mm
Gestational Age

LMP:           12w 4d        Date:  08/09/14                 EDD:   05/16/15
Best:          13w 3d     Det. By:  U/S C R L (11/05/14)     EDD:   05/10/15
Cervix Uterus Adnexa

Cervix:       Closed.
Uterus:       No abnormality visualized.
Cul De Sac:   No free fluid seen.
Left Ovary:    Within normal limits.
Right Ovary:   Within normal limits.

Adnexa:     No abnormality visualized.
Impression

Single IUP at 13w 3d
Normal NT (1.4 mm)
Nasal bone seen
First trimester aneuploidy screen performed as noted above.
Please do not draw triple/quad screen, though patient should
be offered MSAFP for neural tube defect screening.
Recommendations

Recommend ultrasound for fetal anatomy at 18-20 weeks

## 2016-11-07 ENCOUNTER — Encounter (HOSPITAL_BASED_OUTPATIENT_CLINIC_OR_DEPARTMENT_OTHER): Payer: Self-pay | Admitting: Emergency Medicine

## 2016-11-07 ENCOUNTER — Emergency Department (HOSPITAL_BASED_OUTPATIENT_CLINIC_OR_DEPARTMENT_OTHER)
Admission: EM | Admit: 2016-11-07 | Discharge: 2016-11-07 | Disposition: A | Discharge status: Home / Self Care | Payer: Managed Care, Other (non HMO) | Attending: Emergency Medicine | Admitting: Emergency Medicine

## 2016-11-07 DIAGNOSIS — R110 Nausea: Secondary | ICD-10-CM | POA: Insufficient documentation

## 2016-11-07 DIAGNOSIS — Z3201 Encounter for pregnancy test, result positive: Secondary | ICD-10-CM | POA: Insufficient documentation

## 2016-11-07 LAB — PREGNANCY, URINE: PREG TEST UR: POSITIVE — AB

## 2016-11-07 MED ORDER — DOXYLAMINE-PYRIDOXINE 10-10 MG PO TBEC: DELAYED_RELEASE_TABLET | ORAL | 0 refills | Status: DC

## 2016-11-07 MED ORDER — PYRIDOXINE HCL 25 MG PO TABS: 25.0000 mg | ORAL_TABLET | Freq: Three times a day (TID) | ORAL | 0 refills | Status: DC | PRN

## 2016-11-07 NOTE — Discharge Instructions (Signed)
Pregnancy test positive Follow-up with health department for prenatal care Nausea medicine prescribed - see if insurance will cover the combination medicine first if not the other given as needed

## 2016-11-07 NOTE — ED Triage Notes (Signed)
Pt needs pregnancy test on file for insurance purposes. Pt thinks she is [redacted] weeks pregnant. C/o constant nausea.

## 2016-11-07 NOTE — ED Provider Notes (Signed)
[facility]   CSN: [id number] Arrival date & time: 11/07/16  1114   History   Chief Complaint Chief Complaint  Patient presents with  . Nausea    HPI Tracy Hamilton is a 31 y.o. female.  HPI Patient presents to ED with nausea. Patient states that she believes she is pregnant. She took a home pregnancy test that was positive. She has not started receiving prenatal care yet. Has been nauseous for over a week. Has had decreased appetite. Able to keep liquids down. Denies any vomiting. Last menstrual period 09/25/2016. Patient states that this is the exact date. Plans to get prenatal care at the health department. Denies any abdominal pain, vaginal discharge, vaginal bleeding, fevers.   Past Medical History:  Diagnosis Date  . Hx of appendectomy   . Medical history non-contributory     Patient Active Problem List   Diagnosis Date Noted  . Vaginal bleeding in pregnancy 05/06/2015  . Active labor at term 05/06/2015  . Placental abruption 05/06/2015  . Labor and delivery, indication for care 05/06/2015    Past Surgical History:  Procedure Laterality Date  . APPENDECTOMY    . CESAREAN SECTION    . CESAREAN SECTION N/A 05/08/2015   Procedure: CESAREAN SECTION;  Surgeon: Catalina AntiguaPeggy Constant, MD;  Location: [facility];  Service: Obstetrics;  Laterality: N/A;    OB History    Gravida Para Term Preterm AB Living   3 2 2   1 2    SAB TAB Ectopic Multiple Live Births     1   0 2      Home Medications    Prior to Admission medications   Medication Sig Start Date End Date Taking? Authorizing Provider  ibuprofen (ADVIL,MOTRIN) 600 MG tablet Take 1 tablet (600 mg total) by mouth every 6 (six) hours as needed for mild pain. 05/11/15   Arabella MerlesShaw, Kimberly D, CNM  oxyCODONE-acetaminophen (PERCOCET/ROXICET) 5-325 MG tablet Take 1 tablet by mouth every 4 (four) hours as needed (for pain scale 4-7). 05/11/15   Arabella MerlesShaw, Kimberly D, CNM  Prenatal Vit-Fe Fumarate-FA (PRENATAL  VITAMIN PO) Take 1 tablet by mouth daily.     [provider]    Family History Family History  Problem Relation Age of Onset  . Alcohol abuse Neg Hx   . Arthritis Neg Hx   . Asthma Neg Hx   . Birth defects Neg Hx   . Cancer Neg Hx   . COPD Neg Hx   . Depression Neg Hx   . Diabetes Neg Hx   . Drug abuse Neg Hx   . Early death Neg Hx   . Hearing loss Neg Hx   . Heart disease Neg Hx   . Hyperlipidemia Neg Hx   . Hypertension Neg Hx   . Kidney disease Neg Hx   . Learning disabilities Neg Hx   . Mental illness Neg Hx   . Mental retardation Neg Hx   . Miscarriages / Stillbirths Neg Hx   . Stroke Neg Hx   . Vision loss Neg Hx   . Varicose Veins Neg Hx     Social History Social History  Substance Use Topics  . Smoking status: Never Smoker  . Smokeless tobacco: Current User  . Alcohol use No    Allergies   Patient has no known allergies.   Review of Systems Review of Systems All systems reviewed and are negative for acute change except as noted in the HPI.   Physical  Exam Updated Vital Signs BP 128/85 (BP Location: Left Arm)   Pulse 72   Temp 98.9 F (37.2 C) (Oral)   Resp 18   Ht 5\' 8"  (1.727 m)   Wt 250 lb (113.4 kg)   LMP 09/25/2016   SpO2 99%   BMI 38.01 kg/m   Physical Exam  Constitutional: She appears well-developed and well-nourished. No distress.  HENT:  Head: Normocephalic and atraumatic.  Mouth/Throat: Oropharynx is clear and moist.  Eyes: EOM are normal.  Neck: Normal range of motion.  Cardiovascular: Normal rate.   Pulmonary/Chest: Effort normal.  Abdominal: Soft. She exhibits no distension. There is no tenderness. There is no rebound and no guarding.  Musculoskeletal: Normal range of motion.  Neurological: She is alert.  Psychiatric: She has a normal mood and affect.  Nursing note and vitals reviewed.   ED Treatments / Results  Labs (all labs ordered are listed, but only abnormal results are displayed) Labs Reviewed    PREGNANCY, URINE - Abnormal; Notable for the following:       Result Value   Preg Test, Ur POSITIVE (*)    All other components within normal limits    EKG  EKG Interpretation None       Radiology No results found.  Procedures Procedures (including critical care time)  Medications Ordered in ED Medications - No data to display   Initial Impression / Assessment and Plan / ED Course  I have reviewed the triage vital signs and the nursing notes.  Pertinent labs & imaging results that were available during my care of the patient were reviewed by me and considered in my medical decision making (see chart for details).  Patient presents to ED due to nausea and pregnancy. Urine pregnancy test in ED positive. Patient given prescriptions for nausea medication. Encouraged patient to follow-up with health department for prenatal care. Stable for discharge.   Final Clinical Impressions(s) / ED Diagnoses   Final diagnoses:  Nausea  Positive pregnancy test    New Prescriptions New Prescriptions   No medications on file     Pincus Large, DO 11/07/16 1247    Nira Conn, MD 11/07/16 2025

## 2016-12-10 ENCOUNTER — Other Ambulatory Visit (HOSPITAL_COMMUNITY): Payer: Self-pay | Admitting: Nurse Practitioner

## 2016-12-10 DIAGNOSIS — Z3682 Encounter for antenatal screening for nuchal translucency: Secondary | ICD-10-CM

## 2016-12-10 LAB — OB RESULTS CONSOLE ABO/RH: RH TYPE: POSITIVE

## 2016-12-10 LAB — OB RESULTS CONSOLE RUBELLA ANTIBODY, IGM: Rubella: IMMUNE

## 2016-12-10 LAB — OB RESULTS CONSOLE GC/CHLAMYDIA
Chlamydia: NEGATIVE
Gonorrhea: NEGATIVE

## 2016-12-10 LAB — OB RESULTS CONSOLE HIV ANTIBODY (ROUTINE TESTING): HIV: NONREACTIVE

## 2016-12-10 LAB — OB RESULTS CONSOLE HEPATITIS B SURFACE ANTIGEN: HEP B S AG: NEGATIVE

## 2016-12-10 LAB — OB RESULTS CONSOLE RPR: RPR: NONREACTIVE

## 2016-12-10 LAB — OB RESULTS CONSOLE ANTIBODY SCREEN: Antibody Screen: NEGATIVE

## 2016-12-22 ENCOUNTER — Encounter (HOSPITAL_COMMUNITY): Payer: Self-pay | Admitting: *Deleted

## 2016-12-24 ENCOUNTER — Encounter (HOSPITAL_COMMUNITY): Payer: Self-pay

## 2016-12-24 ENCOUNTER — Ambulatory Visit (HOSPITAL_COMMUNITY)
Admission: RE | Admit: 2016-12-24 | Discharge: 2016-12-24 | Disposition: A | Discharge status: Home / Self Care | Payer: Managed Care, Other (non HMO) | Source: Ambulatory Visit | Attending: Nurse Practitioner | Admitting: Nurse Practitioner

## 2016-12-24 ENCOUNTER — Ambulatory Visit (HOSPITAL_COMMUNITY): Payer: Managed Care, Other (non HMO)

## 2017-04-26 ENCOUNTER — Encounter (HOSPITAL_COMMUNITY): Payer: Self-pay

## 2017-04-30 ENCOUNTER — Encounter (HOSPITAL_COMMUNITY): Payer: Self-pay

## 2017-06-11 ENCOUNTER — Encounter (HOSPITAL_COMMUNITY): Payer: Self-pay

## 2017-06-24 ENCOUNTER — Encounter (HOSPITAL_COMMUNITY)
Admission: RE | Admit: 2017-06-24 | Discharge: 2017-06-24 | Disposition: A | Discharge status: Home / Self Care | Payer: Managed Care, Other (non HMO) | Source: Ambulatory Visit | Attending: Obstetrics and Gynecology | Admitting: Obstetrics and Gynecology

## 2017-06-24 LAB — TYPE AND SCREEN
ABO/RH(D): O POS
ANTIBODY SCREEN: NEGATIVE

## 2017-06-24 LAB — CBC
HEMATOCRIT: 34.5 % — AB (ref 36.0–46.0)
HEMOGLOBIN: 11.4 g/dL — AB (ref 12.0–15.0)
MCH: 27.9 pg (ref 26.0–34.0)
MCHC: 33 g/dL (ref 30.0–36.0)
MCV: 84.4 fL (ref 78.0–100.0)
Platelets: 338 10*3/uL (ref 150–400)
RBC: 4.09 MIL/uL (ref 3.87–5.11)
RDW: 15.1 % (ref 11.5–15.5)
WBC: 7.6 10*3/uL (ref 4.0–10.5)

## 2017-06-24 NOTE — Patient Instructions (Signed)
Tracy Hamilton  06/24/2017   Your procedure is scheduled on:  06/25/2017  [facility] 0815 AM.  Pick up the phone at the desk and dial [phone]  Call this number if you have problems the morning of surgery:[phone]  Remember:   Do not eat food:After Midnight.  Do not drink clear liquids: After Midnight.  Take these medicines the morning of surgery with A SIP OF WATER: none   Do not wear jewelry, make-up or nail polish.  Do not wear lotions, powders, or perfumes. Do not wear deodorant.  Do not shave 48 hours prior to surgery.  Do not bring valuables to the hospital.  [facility] is not   responsible for any belongings or valuables brought to the hospital.  Contacts, dentures or bridgework may not be worn into surgery.  Leave suitcase in the car. After surgery it may be brought to your room.  For patients admitted to the hospital, checkout time is 11:00 AM the day of              discharge.    N/A   Please read over the following fact sheets that you were given:   Surgical Site Infection Prevention

## 2017-06-24 NOTE — Anesthesia Preprocedure Evaluation (Signed)
Anesthesia Evaluation  Patient identified by MRN, date of birth, ID band Patient awake    Reviewed: Allergy & Precautions, H&P , NPO status , Patient's Chart, lab work & pertinent test results  Airway Mallampati: II  TM Distance: >3 FB Neck ROM: full    Dental no notable dental hx.    Pulmonary neg pulmonary ROS,    Pulmonary exam normal breath sounds clear to auscultation       Cardiovascular negative cardio ROS Normal cardiovascular exam Rhythm:regular Rate:Normal     Neuro/Psych negative neurological ROS  negative psych ROS   GI/Hepatic negative GI ROS, Neg liver ROS,   Endo/Other  Morbid obesity  Renal/GU negative Renal ROS  negative genitourinary   Musculoskeletal   Abdominal   Peds  Hematology negative hematology ROS (+)   Anesthesia Other Findings     Reproductive/Obstetrics (+) Pregnancy                             Anesthesia Physical  Anesthesia Plan  ASA: III  Anesthesia Plan: Spinal   Post-op Pain Management:    Induction:   PONV Risk Score and Plan: 2 and Scopolamine patch - Pre-op and Treatment may vary due to age or medical condition  Airway Management Planned: Nasal Cannula  Additional Equipment:   Intra-op Plan:   Post-operative Plan:   Informed Consent: I have reviewed the patients History and Physical, chart, labs and discussed the procedure including the risks, benefits and alternatives for the proposed anesthesia with the patient or authorized representative who has indicated his/her understanding and acceptance.   Dental advisory given  Plan Discussed with: CRNA, Anesthesiologist and Surgeon  Anesthesia Plan Comments:         Anesthesia Quick Evaluation

## 2017-06-25 ENCOUNTER — Encounter (HOSPITAL_COMMUNITY): Payer: Self-pay | Admitting: *Deleted

## 2017-06-25 ENCOUNTER — Inpatient Hospital Stay (HOSPITAL_COMMUNITY)
Admission: RE | Admit: 2017-06-25 | Discharge: 2017-06-27 | DRG: 785 | Disposition: A | Discharge status: Home / Self Care | Payer: Managed Care, Other (non HMO) | Source: Ambulatory Visit | Attending: Obstetrics and Gynecology | Admitting: Obstetrics and Gynecology

## 2017-06-25 ENCOUNTER — Inpatient Hospital Stay (HOSPITAL_COMMUNITY): Payer: Managed Care, Other (non HMO) | Admitting: Anesthesiology

## 2017-06-25 ENCOUNTER — Other Ambulatory Visit: Payer: Self-pay

## 2017-06-25 ENCOUNTER — Encounter (HOSPITAL_COMMUNITY)
Admission: RE | Disposition: A | Discharge status: Home / Self Care | Payer: Self-pay | Source: Ambulatory Visit | Attending: Obstetrics and Gynecology

## 2017-06-25 DIAGNOSIS — Z98891 History of uterine scar from previous surgery: Secondary | ICD-10-CM

## 2017-06-25 DIAGNOSIS — Z3A39 39 weeks gestation of pregnancy: Secondary | ICD-10-CM | POA: Diagnosis not present

## 2017-06-25 DIAGNOSIS — Z302 Encounter for sterilization: Secondary | ICD-10-CM | POA: Diagnosis not present

## 2017-06-25 DIAGNOSIS — O34211 Maternal care for low transverse scar from previous cesarean delivery: Principal | ICD-10-CM | POA: Diagnosis present

## 2017-06-25 DIAGNOSIS — O99214 Obesity complicating childbirth: Secondary | ICD-10-CM | POA: Diagnosis present

## 2017-06-25 LAB — RPR: RPR Ser Ql: NONREACTIVE

## 2017-06-25 SURGERY — Surgical Case
Anesthesia: Spinal | Laterality: Bilateral | Wound class: Clean Contaminated

## 2017-06-25 MED ORDER — FENTANYL CITRATE (PF) 100 MCG/2ML IJ SOLN
INTRAMUSCULAR | Status: DC | PRN
  Administered 2017-06-25: 50 ug via INTRAVENOUS
  Administered 2017-06-25: 150 ug via INTRAVENOUS
  Administered 2017-06-25: 125 ug via INTRAVENOUS

## 2017-06-25 MED ORDER — KETOROLAC TROMETHAMINE 30 MG/ML IJ SOLN
30.0000 mg | Freq: Four times a day (QID) | INTRAMUSCULAR | Status: DC
  Administered 2017-06-25 – 2017-06-26 (×3): 30 mg via INTRAVENOUS
  Filled 2017-06-25 (×3): qty 1

## 2017-06-25 MED ORDER — ACETAMINOPHEN 325 MG PO TABS: 325.0000 mg | ORAL_TABLET | ORAL | Status: DC | PRN

## 2017-06-25 MED ORDER — LACTATED RINGERS IV SOLN
INTRAVENOUS | Status: DC | PRN
  Administered 2017-06-25: 40 [IU] via INTRAVENOUS

## 2017-06-25 MED ORDER — ZOLPIDEM TARTRATE 5 MG PO TABS: 5.0000 mg | ORAL_TABLET | Freq: Every evening | ORAL | Status: DC | PRN

## 2017-06-25 MED ORDER — LIDOCAINE-EPINEPHRINE (PF) 2 %-1:200000 IJ SOLN
INTRAMUSCULAR | Status: AC
  Filled 2017-06-25: qty 20

## 2017-06-25 MED ORDER — LIDOCAINE HCL (PF) 1 % IJ SOLN
INTRAMUSCULAR | Status: AC
  Filled 2017-06-25: qty 5

## 2017-06-25 MED ORDER — ACETAMINOPHEN 160 MG/5ML PO SOLN: 325.0000 mg | ORAL | Status: DC | PRN

## 2017-06-25 MED ORDER — SIMETHICONE 80 MG PO CHEW: 80.0000 mg | CHEWABLE_TABLET | ORAL | Status: DC | PRN

## 2017-06-25 MED ORDER — MENTHOL 3 MG MT LOZG: 1.0000 | LOZENGE | OROMUCOSAL | Status: DC | PRN

## 2017-06-25 MED ORDER — PRENATAL MULTIVITAMIN CH
1.0000 | ORAL_TABLET | Freq: Every day | ORAL | Status: DC
  Administered 2017-06-26: 1 via ORAL
  Filled 2017-06-25: qty 1

## 2017-06-25 MED ORDER — SIMETHICONE 80 MG PO CHEW
80.0000 mg | CHEWABLE_TABLET | Freq: Three times a day (TID) | ORAL | Status: DC
  Administered 2017-06-26 (×3): 80 mg via ORAL
  Filled 2017-06-25 (×4): qty 1

## 2017-06-25 MED ORDER — OXYCODONE HCL 5 MG PO TABS: 5.0000 mg | ORAL_TABLET | Freq: Once | ORAL | Status: DC | PRN

## 2017-06-25 MED ORDER — COCONUT OIL OIL: 1.0000 "application " | TOPICAL_OIL | Status: DC | PRN

## 2017-06-25 MED ORDER — ONDANSETRON HCL 4 MG/2ML IJ SOLN
INTRAMUSCULAR | Status: DC | PRN
  Administered 2017-06-25: 4 mg via INTRAVENOUS

## 2017-06-25 MED ORDER — NALOXONE HCL 0.4 MG/ML IJ SOLN
INTRAMUSCULAR | Status: AC
  Filled 2017-06-25: qty 1

## 2017-06-25 MED ORDER — IBUPROFEN 600 MG PO TABS
600.0000 mg | ORAL_TABLET | Freq: Four times a day (QID) | ORAL | Status: DC
  Administered 2017-06-26 – 2017-06-27 (×4): 600 mg via ORAL
  Filled 2017-06-25 (×4): qty 1

## 2017-06-25 MED ORDER — SODIUM BICARBONATE 8.4 % IV SOLN
INTRAVENOUS | Status: AC
  Filled 2017-06-25: qty 50

## 2017-06-25 MED ORDER — SODIUM CHLORIDE 0.9% FLUSH: 9.0000 mL | INTRAVENOUS | Status: DC | PRN

## 2017-06-25 MED ORDER — PHENYLEPHRINE 8 MG IN D5W 100 ML (0.08MG/ML) PREMIX OPTIME
INJECTION | INTRAVENOUS | Status: AC
  Filled 2017-06-25: qty 100

## 2017-06-25 MED ORDER — KETOROLAC TROMETHAMINE 30 MG/ML IJ SOLN
30.0000 mg | Freq: Once | INTRAMUSCULAR | Status: DC | PRN
  Administered 2017-06-25: 30 mg via INTRAVENOUS

## 2017-06-25 MED ORDER — ONDANSETRON HCL 4 MG/2ML IJ SOLN: 4.0000 mg | Freq: Once | INTRAMUSCULAR | Status: DC | PRN

## 2017-06-25 MED ORDER — DIBUCAINE 1 % RE OINT: 1.0000 "application " | TOPICAL_OINTMENT | RECTAL | Status: DC | PRN

## 2017-06-25 MED ORDER — LACTATED RINGERS IV SOLN
INTRAVENOUS | Status: DC
  Administered 2017-06-25: 18:00:00 via INTRAVENOUS

## 2017-06-25 MED ORDER — ONDANSETRON HCL 4 MG/2ML IJ SOLN: 4.0000 mg | Freq: Four times a day (QID) | INTRAMUSCULAR | Status: DC | PRN

## 2017-06-25 MED ORDER — DIPHENHYDRAMINE HCL 12.5 MG/5ML PO ELIX
12.5000 mg | ORAL_SOLUTION | Freq: Four times a day (QID) | ORAL | Status: DC | PRN
  Filled 2017-06-25: qty 5

## 2017-06-25 MED ORDER — MIDAZOLAM HCL 2 MG/2ML IJ SOLN
INTRAMUSCULAR | Status: AC
  Filled 2017-06-25: qty 2

## 2017-06-25 MED ORDER — ACETAMINOPHEN 325 MG PO TABS: 650.0000 mg | ORAL_TABLET | ORAL | Status: DC | PRN

## 2017-06-25 MED ORDER — ONDANSETRON HCL 4 MG/2ML IJ SOLN
INTRAMUSCULAR | Status: AC
  Filled 2017-06-25: qty 2

## 2017-06-25 MED ORDER — SODIUM CHLORIDE 0.9 % IR SOLN
Status: DC | PRN
  Administered 2017-06-25: 1000 mL

## 2017-06-25 MED ORDER — SIMETHICONE 80 MG PO CHEW
80.0000 mg | CHEWABLE_TABLET | ORAL | Status: DC
  Administered 2017-06-26 (×2): 80 mg via ORAL
  Filled 2017-06-25 (×2): qty 1

## 2017-06-25 MED ORDER — NALOXONE HCL 0.4 MG/ML IJ SOLN: 0.4000 mg | INTRAMUSCULAR | Status: DC | PRN

## 2017-06-25 MED ORDER — PROPOFOL 10 MG/ML IV BOLUS
INTRAVENOUS | Status: DC | PRN
  Administered 2017-06-25: 200 mg via INTRAVENOUS

## 2017-06-25 MED ORDER — MORPHINE SULFATE (PF) 0.5 MG/ML IJ SOLN
INTRAMUSCULAR | Status: AC
  Filled 2017-06-25: qty 10

## 2017-06-25 MED ORDER — DEXTROSE 5 % IV SOLN
3.0000 g | INTRAVENOUS | Status: AC
  Administered 2017-06-25: 3 g via INTRAVENOUS
  Filled 2017-06-25: qty 3000

## 2017-06-25 MED ORDER — OXYCODONE HCL 5 MG PO TABS: 5.0000 mg | ORAL_TABLET | ORAL | Status: DC | PRN

## 2017-06-25 MED ORDER — PROPOFOL 10 MG/ML IV BOLUS
INTRAVENOUS | Status: AC
  Filled 2017-06-25: qty 20

## 2017-06-25 MED ORDER — OXYCODONE HCL 5 MG PO TABS
10.0000 mg | ORAL_TABLET | ORAL | Status: DC | PRN
  Administered 2017-06-26 – 2017-06-27 (×2): 10 mg via ORAL
  Filled 2017-06-25 (×2): qty 2

## 2017-06-25 MED ORDER — OXYCODONE HCL 5 MG/5ML PO SOLN: 5.0000 mg | Freq: Once | ORAL | Status: DC | PRN

## 2017-06-25 MED ORDER — DIPHENHYDRAMINE HCL 50 MG/ML IJ SOLN: 12.5000 mg | Freq: Four times a day (QID) | INTRAMUSCULAR | Status: DC | PRN

## 2017-06-25 MED ORDER — OXYTOCIN 40 UNITS IN LACTATED RINGERS INFUSION - SIMPLE MED: 2.5000 [IU]/h | INTRAVENOUS | Status: DC

## 2017-06-25 MED ORDER — SUCCINYLCHOLINE CHLORIDE 200 MG/10ML IV SOSY
PREFILLED_SYRINGE | INTRAVENOUS | Status: AC
  Filled 2017-06-25: qty 10

## 2017-06-25 MED ORDER — MIDAZOLAM HCL 2 MG/2ML IJ SOLN
INTRAMUSCULAR | Status: DC | PRN
  Administered 2017-06-25: 2 mg via INTRAVENOUS

## 2017-06-25 MED ORDER — MEPERIDINE HCL 25 MG/ML IJ SOLN: 6.2500 mg | INTRAMUSCULAR | Status: DC | PRN

## 2017-06-25 MED ORDER — KETOROLAC TROMETHAMINE 30 MG/ML IJ SOLN
INTRAMUSCULAR | Status: AC
Start: 2017-06-25 — End: 2017-06-25
  Filled 2017-06-25: qty 1

## 2017-06-25 MED ORDER — CEFAZOLIN SODIUM-DEXTROSE 2-4 GM/100ML-% IV SOLN: 2.0000 g | INTRAVENOUS | Status: DC

## 2017-06-25 MED ORDER — FENTANYL CITRATE (PF) 100 MCG/2ML IJ SOLN
25.0000 ug | INTRAMUSCULAR | Status: DC | PRN
  Administered 2017-06-25 (×2): 50 ug via INTRAVENOUS
  Administered 2017-06-25: 25 ug via INTRAVENOUS

## 2017-06-25 MED ORDER — FENTANYL CITRATE (PF) 100 MCG/2ML IJ SOLN
INTRAMUSCULAR | Status: AC
  Filled 2017-06-25: qty 2

## 2017-06-25 MED ORDER — OXYTOCIN 10 UNIT/ML IJ SOLN
INTRAMUSCULAR | Status: AC
  Filled 2017-06-25: qty 4

## 2017-06-25 MED ORDER — SOD CITRATE-CITRIC ACID 500-334 MG/5ML PO SOLN
30.0000 mL | ORAL | Status: AC
Start: 2017-06-25 — End: 2017-06-25
  Administered 2017-06-25: 30 mL via ORAL
  Filled 2017-06-25: qty 15

## 2017-06-25 MED ORDER — SUCCINYLCHOLINE CHLORIDE 20 MG/ML IJ SOLN
INTRAMUSCULAR | Status: DC | PRN
  Administered 2017-06-25: 170 mg via INTRAVENOUS

## 2017-06-25 MED ORDER — KETOROLAC TROMETHAMINE 30 MG/ML IJ SOLN
INTRAMUSCULAR | Status: AC
  Filled 2017-06-25: qty 1

## 2017-06-25 MED ORDER — LACTATED RINGERS IV SOLN
INTRAVENOUS | Status: DC
  Administered 2017-06-25 (×4): via INTRAVENOUS

## 2017-06-25 MED ORDER — WITCH HAZEL-GLYCERIN EX PADS
1.0000 | MEDICATED_PAD | CUTANEOUS | Status: DC | PRN
Start: 2017-06-25 — End: 2017-06-27

## 2017-06-25 MED ORDER — FENTANYL CITRATE (PF) 250 MCG/5ML IJ SOLN
INTRAMUSCULAR | Status: AC
Start: 2017-06-25 — End: 2017-06-25
  Filled 2017-06-25: qty 5

## 2017-06-25 MED ORDER — DIPHENHYDRAMINE HCL 25 MG PO CAPS: 25.0000 mg | ORAL_CAPSULE | Freq: Four times a day (QID) | ORAL | Status: DC | PRN

## 2017-06-25 MED ORDER — LIDOCAINE 2% (20 MG/ML) 5 ML SYRINGE
INTRAMUSCULAR | Status: DC | PRN
  Administered 2017-06-25: 80 mg via INTRAVENOUS

## 2017-06-25 MED ORDER — SENNOSIDES-DOCUSATE SODIUM 8.6-50 MG PO TABS
2.0000 | ORAL_TABLET | ORAL | Status: DC
  Administered 2017-06-26 (×2): 2 via ORAL
  Filled 2017-06-25 (×2): qty 2

## 2017-06-25 MED ORDER — MORPHINE SULFATE 2 MG/ML IV SOLN
INTRAVENOUS | Status: DC
  Administered 2017-06-25: 2 mg via INTRAVENOUS
  Administered 2017-06-25: 15:00:00 via INTRAVENOUS
  Filled 2017-06-25: qty 30

## 2017-06-25 MED ORDER — TETANUS-DIPHTH-ACELL PERTUSSIS 5-2.5-18.5 LF-MCG/0.5 IM SUSP: 0.5000 mL | Freq: Once | INTRAMUSCULAR | Status: DC

## 2017-06-25 SURGICAL SUPPLY — 44 items
BENZOIN TINCTURE PRP APPL 2/3 (GAUZE/BANDAGES/DRESSINGS) ×3 IMPLANT
CLAMP CORD UMBIL (MISCELLANEOUS) IMPLANT
CLIP FILSHIE TUBAL LIGA STRL (Clip) ×3 IMPLANT
CLOSURE WOUND 1/2 X4 (GAUZE/BANDAGES/DRESSINGS) ×1
CLOTH BEACON ORANGE TIMEOUT ST (SAFETY) ×3 IMPLANT
DRAPE C SECTION CLR SCREEN (DRAPES) IMPLANT
DRSG OPSITE POSTOP 4X10 (GAUZE/BANDAGES/DRESSINGS) ×3 IMPLANT
DURAPREP 26ML APPLICATOR (WOUND CARE) ×3 IMPLANT
ELECT REM PT RETURN 9FT ADLT (ELECTROSURGICAL) ×3
ELECTRODE REM PT RTRN 9FT ADLT (ELECTROSURGICAL) ×1 IMPLANT
EXTENDER TRAXI PANNICULUS (MISCELLANEOUS) ×1 IMPLANT
EXTRACTOR VACUUM M CUP 4 TUBE (SUCTIONS) IMPLANT
EXTRACTOR VACUUM M CUP 4' TUBE (SUCTIONS)
GAUZE SPONGE 4X4 12PLY STRL LF (GAUZE/BANDAGES/DRESSINGS) ×6 IMPLANT
GLOVE BIO SURGEON STRL SZ7.5 (GLOVE) ×3 IMPLANT
GLOVE BIOGEL PI IND STRL 7.0 (GLOVE) ×2 IMPLANT
GLOVE BIOGEL PI INDICATOR 7.0 (GLOVE) ×4
GOWN STRL REUS W/TWL 2XL LVL3 (GOWN DISPOSABLE) ×3 IMPLANT
GOWN STRL REUS W/TWL LRG LVL3 (GOWN DISPOSABLE) ×6 IMPLANT
HOVERMATT SINGLE USE (MISCELLANEOUS) ×3 IMPLANT
KIT ABG SYR 3ML LUER SLIP (SYRINGE) IMPLANT
NEEDLE HYPO 22GX1.5 SAFETY (NEEDLE) ×3 IMPLANT
NEEDLE HYPO 25X5/8 SAFETYGLIDE (NEEDLE) IMPLANT
NS IRRIG 1000ML POUR BTL (IV SOLUTION) ×3 IMPLANT
PACK C SECTION WH (CUSTOM PROCEDURE TRAY) ×3 IMPLANT
PAD ABD 7.5X8 STRL (GAUZE/BANDAGES/DRESSINGS) ×3 IMPLANT
PAD OB MATERNITY 4.3X12.25 (PERSONAL CARE ITEMS) ×3 IMPLANT
PENCIL SMOKE EVAC W/HOLSTER (ELECTROSURGICAL) ×3 IMPLANT
RTRCTR C-SECT PINK 25CM LRG (MISCELLANEOUS) ×3 IMPLANT
STRIP CLOSURE SKIN 1/2X4 (GAUZE/BANDAGES/DRESSINGS) ×2 IMPLANT
SUT CHROMIC 1 CTX 36 (SUTURE) ×6 IMPLANT
SUT PLAIN 0 NONE (SUTURE) ×3 IMPLANT
SUT VIC AB 1 CT1 27 (SUTURE) ×4
SUT VIC AB 1 CT1 27XBRD ANTBC (SUTURE) ×2 IMPLANT
SUT VIC AB 2-0 CT1 (SUTURE) ×3 IMPLANT
SUT VIC AB 3-0 CT1 27 (SUTURE) ×4
SUT VIC AB 3-0 CT1 TAPERPNT 27 (SUTURE) ×2 IMPLANT
SUT VIC AB 3-0 SH 27 (SUTURE)
SUT VIC AB 3-0 SH 27X BRD (SUTURE) IMPLANT
SUT VIC AB 4-0 KS 27 (SUTURE) ×6 IMPLANT
SYR BULB IRRIGATION 50ML (SYRINGE) IMPLANT
TOWEL OR 17X24 6PK STRL BLUE (TOWEL DISPOSABLE) ×3 IMPLANT
TRAXI PANNICULUS EXTENDER (MISCELLANEOUS) ×2
TRAY FOLEY BAG SILVER LF 14FR (SET/KITS/TRAYS/PACK) ×3 IMPLANT

## 2017-06-25 NOTE — Transfer of Care (Signed)
Immediate Anesthesia Transfer of Care Note  Patient: Tracy Hamilton  Procedure(s) Performed: REPEAT CESAREAN SECTION WITH BILATERAL TUBAL LIGATION (Bilateral )  Patient Location: PACU  Anesthesia Type:General  Level of Consciousness: sedated  Airway & Oxygen Therapy: Patient Spontanous Breathing and Patient connected to nasal cannula oxygen  Post-op Assessment: Report given to RN  Post vital signs: Reviewed and stable  Last Vitals:  Vitals:   06/25/17 0847  BP: 108/64  Pulse: 85  Temp: 36.7 C    Last Pain:  Vitals:   06/25/17 0847  TempSrc: Oral         Complications: No apparent anesthesia complications

## 2017-06-25 NOTE — Anesthesia Procedure Notes (Signed)
Spinal  Patient location during procedure: OR Start time: 06/25/2017 10:15 AM End time: 06/25/2017 10:45 AM Staffing Performed: anesthesiologist  Preanesthetic Checklist Completed: patient identified, surgical consent, pre-op evaluation, timeout performed, IV checked, risks and benefits discussed and monitors and equipment checked Spinal Block Patient position: sitting Prep: ChloraPrep Patient monitoring: blood pressure, continuous pulse ox and heart rate Approach: midline Location: L3-4 Needle Needle type: Pencan, Sprotte and Tuohy  Additional Notes Attempt x3 at multiple levels. Unable to obtain. Decision made for GA.

## 2017-06-25 NOTE — Anesthesia Postprocedure Evaluation (Signed)
Anesthesia Post Note  Patient: Tracy Hamilton  Procedure(s) Performed: REPEAT CESAREAN SECTION WITH BILATERAL TUBAL LIGATION (Bilateral )     Patient location during evaluation: PACU Anesthesia Type: Spinal Level of consciousness: oriented and awake and alert Pain management: pain level controlled Vital Signs Assessment: post-procedure vital signs reviewed and stable Respiratory status: spontaneous breathing, respiratory function stable and patient connected to nasal cannula oxygen Cardiovascular status: blood pressure returned to baseline and stable Postop Assessment: no headache, no backache and no apparent nausea or vomiting Anesthetic complications: no    Last Vitals:  Vitals:   06/25/17 1215 06/25/17 1231  BP: 130/84   Pulse: 77   Resp: 19   Temp:  36.9 C  SpO2: 100%     Last Pain:  Vitals:   06/25/17 1231  TempSrc: Oral  PainSc: 7    Pain Goal:                 Anber Mckiver

## 2017-06-25 NOTE — Lactation Note (Signed)
This note was copied from a baby's chart. Lactation Consultation Note  Patient Name: Tracy Hamilton Today's Date: 06/25/2017   P3,  Baby 11 hours.  First time breastfeeding. Mother has been primarily formula feeding. MD request to help mother w/ breastfeeding.  RN had attempted helping mother breastfeed x2. Mother has short shaft nipples.  Provided shells and hand pump w/ instruction. Demonstrated how to hand express and prepump w/ manual pump before latching. [facility] received 10 ml of formula and was sleeping during consult. RN had set up DEBP.  LC helped with pumping.  Mother received approx 8 ml of colostrum. Reviewed cleaning and milk storage. Mother has personal DEBP at home. Mom encouraged to feed baby 8-12 times/24 hours and with feeding cues.  Mom made aware of O/P services, breastfeeding support groups, community resources, and our phone # for post-discharge questions.  Recommend until baby is able to latch, mother will need to pump q hours and give volume back to baby w/ cues. Placed roll under breast to lift.  RN tried latching with NS. Unsure if mother is candidate at this time.         Maternal Data    Feeding Feeding Type: Bottle Fed - Formula Length of feed: 5 min(per mom )  LATCH Score Latch: Repeated attempts needed to sustain latch, nipple held in mouth throughout feeding, stimulation needed to elicit sucking reflex.  Audible Swallowing: A few with stimulation  Type of Nipple: Flat  Comfort (Breast/Nipple): Soft / non-tender  Hold (Positioning): Assistance needed to correctly position infant at breast and maintain latch.  LATCH Score: 6  Interventions Interventions: DEBP(nipple shields )  Lactation Tools Discussed/Used Tools: Nipple Dorris CarnesShields   Consult Status      Tracy Hamilton, [facility] 06/25/2017, 10:46 PM

## 2017-06-25 NOTE — H&P (Signed)
Obstetric Preoperative History and Physical  Tracy Hamilton is a 32 y.o. [id number] with IUP at [redacted]w[redacted]d presenting for scheduled cesarean section and bilateral tubal ligation.  No acute concerns.   Prenatal Course Source of Care: [facility]  Pregnancy complications or risks: -2 prior c-sections -undesired fertility Patient Active Problem List   Diagnosis Date Noted  . Vaginal bleeding in pregnancy 05/06/2015  . Active labor at term 05/06/2015  . Placental abruption 05/06/2015  . Labor and delivery, indication for care 05/06/2015   She plans to breastfeed, plans to bottle feed She desires bilateral tubal ligation for postpartum contraception.   Prenatal labs and studies: ABO, Rh: --/--/O POS (01/03 0930) Antibody: NEG (01/03 0930) Rubella: Immune (06/21 0000) RPR: Non Reactive (01/03 0930)  HBsAg: Negative (06/21 0000)  HIV: Non-reactive (06/21 0000)  GBS: Negative 1 hr Glucola  64 Genetic screening normal Anatomy US normal  Prenatal Transfer Tool  Maternal Diabetes: No Genetic Screening: Normal Maternal Ultrasounds/Referrals: Normal Fetal Ultrasounds or other Referrals:  None Maternal Substance Abuse:  No Significant Maternal Medications:  None Significant Maternal Lab Results: Lab values include: Group B Strep negative  Past Medical History:  Diagnosis Date  . Hx of appendectomy   . Medical history non-contributory     Past Surgical History:  Procedure Laterality Date  . APPENDECTOMY    . CESAREAN SECTION    . CESAREAN SECTION N/A 05/08/2015   Procedure: CESAREAN SECTION;  Surgeon: Catalina Antigua, MD;  Location: [facility];  Service: Obstetrics;  Laterality: N/A;    OB History  Gravida Para Term Preterm AB Living  4 2 2   1 2   SAB TAB Ectopic Multiple Live Births    1   0 2    # Outcome Date GA Lbr Len/2nd Weight Sex Delivery Anes PTL Lv  4 Current           3 Term 05/08/15 [redacted]w[redacted]d  3.13 kg (6 lb 14.4 oz) M CS-LVertical EPI  LIV  2 Term 08/05/07 [redacted]w[redacted]d    CS-Unspec    LIV  1 TAB               Social History   Socioeconomic History  . Marital status: Single    Spouse name: None  . Number of children: None  . Years of education: None  . Highest education level: None  Social Needs  . Financial resource strain: None  . Food insecurity - worry: None  . Food insecurity - inability: None  . Transportation needs - medical: None  . Transportation needs - non-medical: None  Occupational History  . None  Tobacco Use  . Smoking status: Never Smoker  . Smokeless tobacco: Current User  Substance and Sexual Activity  . Alcohol use: No  . Drug use: No  . Sexual activity: Yes    Birth control/protection: None  Other Topics Concern  . None  Social History Narrative  . None    Family History  Problem Relation Age of Onset  . Alcohol abuse Neg Hx   . Arthritis Neg Hx   . Asthma Neg Hx   . Birth defects Neg Hx   . Cancer Neg Hx   . COPD Neg Hx   . Depression Neg Hx   . Diabetes Neg Hx   . Drug abuse Neg Hx   . Early death Neg Hx   . Hearing loss Neg Hx   . Heart disease Neg Hx   . Hyperlipidemia Neg Hx   . Hypertension  Neg Hx   . Kidney disease Neg Hx   . Learning disabilities Neg Hx   . Mental illness Neg Hx   . Mental retardation Neg Hx   . Miscarriages / Stillbirths Neg Hx   . Stroke Neg Hx   . Vision loss Neg Hx   . Varicose Veins Neg Hx     Medications Prior to Admission  Medication Sig Dispense Refill Last Dose  . Prenatal Vit-Fe Fumarate-FA (PRENATAL VITAMIN PO) Take 1 tablet by mouth daily.    06/24/2017 at Unknown time  . Doxylamine-Pyridoxine 10-10 MG TBEC Initial: 2 tabs at bedtime on day 1 and 2; if symptoms persist, take 1 tab in morning and 2 tabs at bedtime on day 3; if persist, may increase to 1 tab in AM, 1 tab mid-afternoon, and 2 tabs at bedtime (Patient not taking: Reported on 06/17/2017) 60 tablet 0 Completed Course at Unknown time  . ibuprofen (ADVIL,MOTRIN) 600 MG tablet Take 1 tablet (600 mg total) by mouth every 6  (six) hours as needed for mild pain. (Patient not taking: Reported on 06/17/2017) 50 tablet 0 Completed Course at Unknown time  . oxyCODONE-acetaminophen (PERCOCET/ROXICET) 5-325 MG tablet Take 1 tablet by mouth every 4 (four) hours as needed (for pain scale 4-7). (Patient not taking: Reported on 06/17/2017) 50 tablet 0 Completed Course at Unknown time  . pyridOXINE (VITAMIN B-6) 25 MG tablet Take 1 tablet (25 mg total) by mouth 3 (three) times daily as needed (nausea/vomiting in pregnancy). (Patient not taking: Reported on 06/17/2017) 60 tablet 0 Completed Course at Unknown time    No Known Allergies  Review of Systems: Negative except for what is mentioned in HPI.  Physical Exam: BP 108/64   Pulse 85   Temp 98.1 F (36.7 C) (Oral)   Ht 5\' 7"  (1.702 m)   Wt 125.3 kg (276 lb 3 oz)   LMP 09/25/2016   BMI 43.26 kg/m  FHR by Doppler: 128 bpm CONSTITUTIONAL: Well-developed, well-nourished female in no acute distress.  HENT:  Normocephalic, atraumatic, External right and left ear normal. Oropharynx is clear and moist EYES: Conjunctivae and EOM are normal. Pupils are equal, round, and reactive to light. No scleral icterus.  NECK: Normal range of motion, supple, no masses SKIN: Skin is warm and dry. No rash noted. Not diaphoretic. No erythema. No pallor. NEUROLGIC: Alert and oriented to person, place, and time. Normal reflexes, muscle tone coordination. No cranial nerve deficit noted. PSYCHIATRIC: Normal mood and affect. Normal behavior. Normal judgment and thought content. CARDIOVASCULAR: Normal heart rate noted, regular rhythm RESPIRATORY: Effort and breath sounds normal, no problems with respiration noted ABDOMEN: Soft, nontender, nondistended, gravid. Well-healed Pfannenstiel incision. PELVIC: Deferred MUSCULOSKELETAL: Normal range of motion. No edema and no tenderness. 2+ distal pulses.   Pertinent Labs/Studies:   Results for orders placed or performed during the hospital encounter  of 06/24/17 (from the past 72 hour(s))  CBC     Status: Abnormal   Collection Time: 06/24/17  9:30 AM  Result Value Ref Range   WBC 7.6 4.0 - 10.5 K/uL   RBC 4.09 3.87 - 5.11 MIL/uL   Hemoglobin 11.4 (L) 12.0 - 15.0 g/dL   HCT 16.1 (L) 09.6 - 04.5 %   MCV 84.4 78.0 - 100.0 fL   MCH 27.9 26.0 - 34.0 pg   MCHC 33.0 30.0 - 36.0 g/dL   RDW 40.9 81.1 - 91.4 %   Platelets 338 150 - 400 K/uL  RPR     Status:  None   Collection Time: 06/24/17  9:30 AM  Result Value Ref Range   RPR Ser Ql Non Reactive Non Reactive    Comment: (NOTE) Performed At: [facility] [address] [place], [place] [id number] Jolene SchimkeNagendra Sanjai MD MV:[phone]   Type and screen     Status: None   Collection Time: 06/24/17  9:30 AM  Result Value Ref Range   ABO/RH(D) O POS    Antibody Screen NEG    Sample Expiration 06/27/2017     Assessment and Plan :Uvaldo BristleShameka Noto is a 32 y.o. [id number] at 8243w0d being admitted for scheduled cesarean section. The risks of cesarean section were discussed with the patient including but were not limited to: bleeding which may require transfusion or reoperation; infection which may require antibiotics; injury to bowel, bladder, ureters or other surrounding organs; injury to the fetus; need for additional procedures including hysterectomy in the event of a life-threatening hemorrhage; placental abnormalities wth subsequent pregnancies, incisional problems, thromboembolic phenomenon and other postoperative/anesthesia complications.  Patient also desires permanent sterilization. Other reversible forms of contraception were discussed with patient; she declines all other modalities. Risks of procedure discussed with patient including but not limited to: risk of regret, permanence of method, bleeding, infection, injury to surrounding organs and need for additional procedures.  Failure risk of 1-2% with increased risk of ectopic gestation if pregnancy occurs was also discussed with patient. The  patient concurred with the proposed plan, giving informed written consent for the procedure. Patient has been NPO since last night she will remain NPO for procedure. Anesthesia and OR aware. Preoperative prophylactic antibiotics and SCDs ordered on call to the OR. To OR when ready.     Caryl AdaJazma Phelps, DO OB Fellow

## 2017-06-25 NOTE — Anesthesia Procedure Notes (Cosign Needed)
Procedure Name: Intubation Date/Time: 06/25/2017 10:41 AM Performed by: Bethena Midgetddono, Ernest, MD Pre-anesthesia Checklist: Patient identified, Emergency Drugs available, Suction available and Patient being monitored Patient Re-evaluated:Patient Re-evaluated prior to induction Oxygen Delivery Method: Circle system utilized Preoxygenation: Pre-oxygenation with 100% oxygen Induction Type: Rapid sequence, IV induction and Cricoid Pressure applied Laryngoscope Size: Glidescope and 3 Grade View: Grade I Tube type: Oral Tube size: 7.0 mm Number of attempts: 1 Airway Equipment and Method: Stylet and Video-laryngoscopy Placement Confirmation: ETT inserted through vocal cords under direct vision,  positive ETCO2 and breath sounds checked- equal and bilateral Secured at: 22 cm Tube secured with: Tape Dental Injury: Teeth and Oropharynx as per pre-operative assessment

## 2017-06-25 NOTE — Op Note (Signed)
Tracy Hamilton PROCEDURE DATE: 06/25/2017  PREOPERATIVE DIAGNOSES: Intrauterine pregnancy at 423w0d weeks gestation; Elective repeat c/s; 2 prior c/s, undesired fertility  POSTOPERATIVE DIAGNOSES: The same  PROCEDURE: Repeat Low Transverse Cesarean Section  SURGEON:  Dr. Nettie ElmMichael Ervin and Dr. Caryl AdaJazma Phelps  ANESTHESIOLOGY TEAM: Anesthesiologist: Bethena Midgetddono, Ernest, MD CRNA: Algis GreenhouseBurger, Linda A, CRNA  INDICATIONS: Tracy Hamilton is a 32 y.o. [id number] at 323w0d here for cesarean section secondary to the indications listed under preoperative diagnoses; please see preoperative note for further details.  The risks of cesarean section and BTL were discussed with the patient and written informed consent obtained.   FINDINGS:  Viable female infant in cephalic presentation.  Apgars 9 and 9.  Clear amniotic fluid.  Intact placenta, three vessel cord.  Normal uterus, fallopian tubes and ovaries bilaterally.  ANESTHESIA: General  INTRAVENOUS FLUIDS: 2500 ml   ESTIMATED BLOOD LOSS: 1075 ml URINE OUTPUT:  25 ml SPECIMENS: Placenta sent to L&D COMPLICATIONS: None immediate  PROCEDURE IN DETAIL:  The patient preoperatively received intravenous antibiotics and had sequential compression devices applied to her lower extremities.  She was then taken to the operating room where general anesthesia was administered due to failed attempt at spinal x3. She was prepped and draped in a sterile manner.  A foley catheter was placed into her bladder and attached to constant gravity.  After an adequate timeout was performed, a Pfannenstiel skin incision was made with scalpel over her preexisting scar and carried through to the underlying layer of fascia. The fascia was incised in the midline, and this incision was extended bilaterally using the Mayo scissors.  Kocher clamps were applied to the superior aspect of the fascial incision and the underlying rectus muscles were dissected off bluntly.  A similar process was carried out on  the inferior aspect of the fascial incision. The rectus muscles were separated in the midline bluntly and the peritoneum was entered bluntly. Alexis retracted placed with bladder blade.  Attention was turned to the lower uterine segment where a low transverse hysterotomy was made with a scalpel and extended bilaterally bluntly.  The infant was successfully delivered, the cord was clamped and cut after one minute, and the infant was handed over to the awaiting neonatology team. Uterine massage was then administered, and the placenta delivered intact with a three-vessel cord. The uterus was then cleared of clots and debris. Uterus was exteriorized. The hysterotomy was closed with Chromic in a running locked fashion; single layer. The pelvis was cleared of all clot and debris. Hemostasis was confirmed on all surfaces.  Attention was then turned to the fallopian tubes, and Filshie clips were placed about 3 cm from the cornua, with care given to incorporate the underlying mesosalpinx on both sides, allowing for bilateral tubal sterilization.  The rectus muscles were reapproximated using Vicryl interrupted stitches. The fascia was then closed using Vicryl in a running fashion.  The subcutaneous layer was irrigated, then reapproximated with plain gut interrupted stitches. The skin was closed with a 4-0 Vicryl subcuticular stitch. The patient tolerated the procedure well. Sponge, lap, instrument and needle counts were correct x 3.  She was taken to the recovery room in stable condition.   Caryl AdaJazma Phelps, DO OB Fellow

## 2017-06-26 LAB — CBC
HCT: 27.1 % — ABNORMAL LOW (ref 36.0–46.0)
HEMOGLOBIN: 9.2 g/dL — AB (ref 12.0–15.0)
MCH: 28.5 pg (ref 26.0–34.0)
MCHC: 33.9 g/dL (ref 30.0–36.0)
MCV: 83.9 fL (ref 78.0–100.0)
PLATELETS: 257 10*3/uL (ref 150–400)
RBC: 3.23 MIL/uL — ABNORMAL LOW (ref 3.87–5.11)
RDW: 15.2 % (ref 11.5–15.5)
WBC: 8.8 10*3/uL (ref 4.0–10.5)

## 2017-06-26 LAB — BIRTH TISSUE RECOVERY COLLECTION (PLACENTA DONATION)

## 2017-06-26 NOTE — Progress Notes (Signed)
Subjective: Postpartum Day #1: Cesarean Delivery & BTL Patient reports tolerating PO, + flatus and no problems voiding.  Breast and bottlefeeding. Denies dizziness with amb; pt up in chair and looks well. No issues with voiding.  Objective: Vital signs in last 24 hours: Temp:  [98.1 F (36.7 C)-98.9 F (37.2 C)] 98.9 F (37.2 C) (01/05 0720) Pulse Rate:  [66-89] 78 (01/05 0720) Resp:  [16-22] 19 (01/05 0720) BP: (109-134)/(51-93) 127/93 (01/05 0720) SpO2:  [96 %-100 %] 98 % (01/05 0720)  Physical Exam:  General: alert, cooperative and no distress Lochia: appropriate Uterine Fundus: firm Incision: pressure dsg intact; honeycomb stained underneath DVT Evaluation: No evidence of DVT seen on physical exam.  Recent Labs    06/24/17 0930 06/26/17 0529  HGB 11.4* 9.2*  HCT 34.5* 27.1*    Assessment/Plan: Status post Cesarean section. Doing well postoperatively.  Continue current care. Anticipate d/c on 06/27/17.  Cam HaiSHAW, Tracy Hamilton CNM 06/26/2017, 9:15 AM

## 2017-06-26 NOTE — Progress Notes (Signed)
Contact the pharmacy unable to document waste of morphine in the pyxis. Advised me to make a note in the patient chart verified by a second RN. Wasted 27 cc of morphine in sink witnessed by Shirlee Moreonna Esker RN.

## 2017-06-26 NOTE — Progress Notes (Signed)
Contacted anesthesia, order to discontinue PCA medication obtained.

## 2017-06-27 MED ORDER — OXYCODONE HCL 5 MG PO TABS: 5.0000 mg | ORAL_TABLET | ORAL | 0 refills | Status: AC | PRN

## 2017-06-27 NOTE — Discharge Instructions (Signed)

## 2017-06-27 NOTE — Plan of Care (Signed)
Discharge requirements have been met and RN completed discharge education.  Patient voiced understanding of discharge education and all questions were answered.

## 2017-06-27 NOTE — Discharge Summary (Signed)
OB Discharge Summary     Patient Name: Tracy Hamilton DOB: 08/15/1985 MRN: [id number]  Date of admission: 06/25/2017 Delivering MD: Hermina StaggersERVIN, MICHAEL L   Date of discharge: 06/27/2017  Admitting diagnosis: RCS Undesired Fertility Intrauterine pregnancy: 7033w0d     Secondary diagnosis:  Active Problems:   Status post repeat low transverse cesarean section  Additional problems: n/a     Discharge diagnosis: Term Pregnancy Delivered                                                                                                Post partum procedures:postpartum tubal ligation  Augmentation: n/a  Complications: None  Hospital course:  Sceduled C/S   32 y.o. yo [id number] at 3333w0d was admitted to the hospital 06/25/2017 for scheduled cesarean section with the following indication:Elective Repeat.  Membrane Rupture Time/Date: 10:46 AM ,06/25/2017   Patient delivered a Viable infant.06/25/2017  Details of operation can be found in separate operative note.  Pateint had an uncomplicated postpartum course.  She is ambulating, tolerating a regular diet, passing flatus, and urinating well. Patient is discharged home in stable condition on  06/27/17         Physical exam  Vitals:   06/26/17 1007 06/26/17 1120 06/26/17 1813 06/27/17 0538  BP:  118/69 124/77 (!) 121/96  Pulse:  81 78 82  Resp:  18 18 18   Temp:  98.5 F (36.9 C) 98.3 F (36.8 C) 98.5 F (36.9 C)  TempSrc:  Oral  Oral  SpO2: 98% 100%    Weight:      Height:       General: alert, cooperative and no distress Lochia: appropriate Uterine Fundus: firm Incision: Healing well with no significant drainage, Dressing is clean, dry, and intact DVT Evaluation: No evidence of DVT seen on physical exam. Labs: Lab Results  Component Value Date   WBC 8.8 06/26/2017   HGB 9.2 (L) 06/26/2017   HCT 27.1 (L) 06/26/2017   MCV 83.9 06/26/2017   PLT 257 06/26/2017   No flowsheet data found.  Discharge instruction: per After Visit Summary and  "Baby and Me Booklet".  After visit meds:  Allergies as of 06/27/2017   No Known Allergies     Medication List    STOP taking these medications   Doxylamine-Pyridoxine 10-10 MG Tbec   oxyCODONE-acetaminophen 5-325 MG tablet Commonly known as:  PERCOCET/ROXICET   pyridOXINE 25 MG tablet Commonly known as:  VITAMIN B-6     TAKE these medications   ibuprofen 600 MG tablet Commonly known as:  ADVIL,MOTRIN Take 1 tablet (600 mg total) by mouth every 6 (six) hours as needed for mild pain.   PRENATAL VITAMIN PO Take 1 tablet by mouth daily.       Diet: routine diet  Activity: Advance as tolerated. Pelvic rest for 6 weeks.   Outpatient follow up:4 weeks Follow up Appt:No future appointments. Follow up Visit:No Follow-up on file.  Postpartum contraception: Tubal Ligation  Newborn Data: Live born female  Birth Weight: 7 lb 7.2 oz (3380 g) APGAR: 9, 9  Newborn Delivery   Birth date/time:  06/25/2017 10:47:00 Delivery type:  C-Section, Low Transverse C-section categorization:  Repeat     Baby Feeding: Bottle Disposition:home with mother   06/27/2017 Rolm Bookbinder, CNM

## 2017-06-28 ENCOUNTER — Encounter (HOSPITAL_COMMUNITY): Payer: Self-pay

## 2017-08-08 ENCOUNTER — Encounter (HOSPITAL_BASED_OUTPATIENT_CLINIC_OR_DEPARTMENT_OTHER): Payer: Self-pay | Admitting: *Deleted

## 2017-08-08 ENCOUNTER — Other Ambulatory Visit: Payer: Self-pay

## 2017-08-08 DIAGNOSIS — Y9301 Activity, walking, marching and hiking: Secondary | ICD-10-CM | POA: Diagnosis not present

## 2017-08-08 DIAGNOSIS — Y999 Unspecified external cause status: Secondary | ICD-10-CM | POA: Diagnosis not present

## 2017-08-08 DIAGNOSIS — S39012A Strain of muscle, fascia and tendon of lower back, initial encounter: Secondary | ICD-10-CM | POA: Insufficient documentation

## 2017-08-08 DIAGNOSIS — Z79899 Other long term (current) drug therapy: Secondary | ICD-10-CM | POA: Diagnosis not present

## 2017-08-08 DIAGNOSIS — W010XXA Fall on same level from slipping, tripping and stumbling without subsequent striking against object, initial encounter: Secondary | ICD-10-CM | POA: Insufficient documentation

## 2017-08-08 DIAGNOSIS — S3992XA Unspecified injury of lower back, initial encounter: Secondary | ICD-10-CM | POA: Diagnosis present

## 2017-08-08 DIAGNOSIS — Y92512 Supermarket, store or market as the place of occurrence of the external cause: Secondary | ICD-10-CM | POA: Diagnosis not present

## 2017-08-08 LAB — PREGNANCY, URINE: PREG TEST UR: NEGATIVE

## 2017-08-08 NOTE — ED Triage Notes (Signed)
Pt reports she slipped and fell on tile floor today at a store. C/o back and neck pain. Ambulatory to triage without difficulty

## 2017-08-09 ENCOUNTER — Emergency Department (HOSPITAL_BASED_OUTPATIENT_CLINIC_OR_DEPARTMENT_OTHER)
Admission: EM | Admit: 2017-08-09 | Discharge: 2017-08-09 | Disposition: A | Discharge status: Home / Self Care | Payer: Managed Care, Other (non HMO) | Attending: Emergency Medicine | Admitting: Emergency Medicine

## 2017-08-09 DIAGNOSIS — W19XXXA Unspecified fall, initial encounter: Secondary | ICD-10-CM

## 2017-08-09 DIAGNOSIS — S39012A Strain of muscle, fascia and tendon of lower back, initial encounter: Secondary | ICD-10-CM | POA: Diagnosis not present

## 2017-08-09 DIAGNOSIS — T148XXA Other injury of unspecified body region, initial encounter: Secondary | ICD-10-CM

## 2017-08-09 MED ORDER — IBUPROFEN 400 MG PO TABS
400.0000 mg | ORAL_TABLET | Freq: Once | ORAL | Status: AC
  Administered 2017-08-09: 400 mg via ORAL
  Filled 2017-08-09: qty 1

## 2017-08-09 NOTE — ED Provider Notes (Signed)
[facility] EMERGENCY DEPARTMENT Provider Note   CSN: [id number] Arrival date & time: 08/08/17  2247     History   Chief Complaint Chief Complaint  Patient presents with  . Fall    HPI Tracy Hamilton is a 32 y.o. female.  The history is provided by the patient.  Fall  This is a new problem. The current episode started 6 to 12 hours ago. The problem occurs constantly. The problem has been gradually worsening. Pertinent negatives include no chest pain, no abdominal pain, no headaches and no shortness of breath. The symptoms are aggravated by bending and twisting. The symptoms are relieved by rest. She has tried nothing for the symptoms.   She reports she fell at a store over 6-12 hours ago.  She reports she slipped and fell backwards.  No LOC.  No head injury.  Denies neck pain.  No chest pain/abdominal pain.  No shortness of breath.  She reports low back pain.  No weakness reported.  She is ambulatory Past Medical History:  Diagnosis Date  . Hx of appendectomy   . Medical history non-contributory     Patient Active Problem List   Diagnosis Date Noted  . Status post repeat low transverse cesarean section 06/25/2017  . Vaginal bleeding in pregnancy 05/06/2015  . Active labor at term 05/06/2015  . Placental abruption 05/06/2015  . Labor and delivery, indication for care 05/06/2015    Past Surgical History:  Procedure Laterality Date  . APPENDECTOMY    . CESAREAN SECTION    . CESAREAN SECTION N/A 05/08/2015   Procedure: CESAREAN SECTION;  Surgeon: Catalina Antigua, MD;  Location: [facility];  Service: Obstetrics;  Laterality: N/A;  . CESAREAN SECTION WITH BILATERAL TUBAL LIGATION Bilateral 06/25/2017   Procedure: REPEAT CESAREAN SECTION WITH BILATERAL TUBAL LIGATION;  Surgeon: Hermina Staggers, MD;  Location: [facility];  Service: Obstetrics;  Laterality: Bilateral;    OB History    Gravida Para Term Preterm AB Living   4 3 3   1 3    SAB TAB Ectopic Multiple  Live Births     1   0 3       Home Medications    Prior to Admission medications   Medication Sig Start Date End Date Taking? Authorizing Provider  Prenatal Vit-Fe Fumarate-FA (PRENATAL VITAMIN PO) Take 1 tablet by mouth daily.    Yes [provider]  ibuprofen (ADVIL,MOTRIN) 600 MG tablet Take 1 tablet (600 mg total) by mouth every 6 (six) hours as needed for mild pain. Patient not taking: Reported on 06/17/2017 05/11/15   Cam Hai D, CNM  oxyCODONE (OXY IR/ROXICODONE) 5 MG immediate release tablet Take 1 tablet (5 mg total) by mouth every 4 (four) hours as needed (pain scale 4-7). 06/27/17   Oralia Manis, DO    Family History Family History  Problem Relation Age of Onset  . Alcohol abuse Neg Hx   . Arthritis Neg Hx   . Asthma Neg Hx   . Birth defects Neg Hx   . Cancer Neg Hx   . COPD Neg Hx   . Depression Neg Hx   . Diabetes Neg Hx   . Drug abuse Neg Hx   . Early death Neg Hx   . Hearing loss Neg Hx   . Heart disease Neg Hx   . Hyperlipidemia Neg Hx   . Hypertension Neg Hx   . Kidney disease Neg Hx   . Learning disabilities Neg Hx   .  Mental illness Neg Hx   . Mental retardation Neg Hx   . Miscarriages / Stillbirths Neg Hx   . Stroke Neg Hx   . Vision loss Neg Hx   . Varicose Veins Neg Hx     Social History Social History   Tobacco Use  . Smoking status: Never Smoker  . Smokeless tobacco: Current User  Substance Use Topics  . Alcohol use: No  . Drug use: No     Allergies   Patient has no known allergies.   Review of Systems Review of Systems  Constitutional: Negative for fever.  Respiratory: Negative for shortness of breath.   Cardiovascular: Negative for chest pain.  Gastrointestinal: Negative for abdominal pain.  Musculoskeletal: Positive for back pain. Negative for neck pain.  Neurological: Negative for headaches.  All other systems reviewed and are negative.    Physical Exam Updated Vital Signs BP 120/79 (BP Location:  Right Arm)   Pulse 73   Temp 98.1 F (36.7 C) (Oral)   Resp 18   Ht 1.676 m (5\' 6" )   Wt 113.4 kg (250 lb)   LMP 08/01/2017   SpO2 100%   Breastfeeding? No   BMI 40.35 kg/m   Physical Exam CONSTITUTIONAL: Well developed/well nourished HEAD: Normocephalic/atraumatic EYES: EOMI/PERRL ENMT: Mucous membranes moist NECK: supple no meningeal signs SPINE/BACK:entire spine nontender, lumbar paraspinal tenderness.  No bruising noted. CV: S1/S2 noted, no murmurs/rubs/gallops noted LUNGS: Lungs are clear to auscultation bilaterally, no apparent distress ABDOMEN: soft, nontender, no rebound or guarding, bowel sounds noted throughout abdomen, obesity GU:no cva tenderness NEURO: Pt is awake/alert/appropriate, moves all extremitiesx4.  No facial droop.   EXTREMITIES: pulses normal/equal, full ROM, All extremities/joints palpated/ranged and nontender SKIN: warm, color normal PSYCH: no abnormalities of mood noted, alert and oriented to situation   ED Treatments / Results  Labs (all labs ordered are listed, but only abnormal results are displayed) Labs Reviewed  PREGNANCY, URINE    EKG  EKG Interpretation None       Radiology No results found.  Procedures Procedures (including critical care time)  Medications Ordered in ED Medications  ibuprofen (ADVIL,MOTRIN) tablet 400 mg (400 mg Oral Given 08/09/17 0244)     Initial Impression / Assessment and Plan / ED Course  I have reviewed the triage vital signs and the nursing notes.  Pertinent labs  results that were available during my care of the patient were reviewed by me and considered in my medical decision making (see chart for details).     Patient presents after mechanical fall.  No signs of any acute spinal injury.  No distress noted.  Likely muscle strain.  Discharged home Defer imaging for now  Final Clinical Impressions(s) / ED Diagnoses   Final diagnoses:  Fall, initial encounter  Muscle strain    ED  Discharge Orders    None       Zadie RhineWickline, Mackynzie Woolford, MD 08/09/17 [id number]

## 2017-08-09 NOTE — ED Notes (Signed)
DC papers reviewed. Pt verbalized understanding. Signature pad not working.
